# Patient Record
Sex: Male | Born: 1935 | Race: White | Hispanic: No | Smoking: Former smoker
Health system: Southern US, Community
[De-identification: ages and names within clinical notes are randomized; demographics above are authoritative.]

## PROBLEM LIST (undated history)

## (undated) DIAGNOSIS — Z950 Presence of cardiac pacemaker: Secondary | ICD-10-CM

## (undated) DIAGNOSIS — R0600 Dyspnea, unspecified: Secondary | ICD-10-CM

## (undated) DIAGNOSIS — M109 Gout, unspecified: Secondary | ICD-10-CM

## (undated) DIAGNOSIS — Z972 Presence of dental prosthetic device (complete) (partial): Secondary | ICD-10-CM

## (undated) DIAGNOSIS — Z9289 Personal history of other medical treatment: Secondary | ICD-10-CM

## (undated) DIAGNOSIS — I639 Cerebral infarction, unspecified: Secondary | ICD-10-CM

## (undated) DIAGNOSIS — D649 Anemia, unspecified: Secondary | ICD-10-CM

## (undated) DIAGNOSIS — I10 Essential (primary) hypertension: Secondary | ICD-10-CM

## (undated) HISTORY — PX: BACK SURGERY: SHX140

## (undated) HISTORY — PX: HERNIA REPAIR: SHX51

---

## 1999-04-13 ENCOUNTER — Encounter: Admission: RE | Admit: 1999-04-13 | Discharge: 1999-07-12 | Payer: Self-pay | Admitting: Internal Medicine

## 2000-06-16 ENCOUNTER — Ambulatory Visit (HOSPITAL_COMMUNITY): Admission: RE | Admit: 2000-06-16 | Discharge: 2000-06-16 | Payer: Self-pay | Admitting: General Surgery

## 2003-06-01 ENCOUNTER — Ambulatory Visit (HOSPITAL_COMMUNITY): Admission: RE | Admit: 2003-06-01 | Discharge: 2003-06-01 | Payer: Self-pay | Admitting: Internal Medicine

## 2004-11-18 ENCOUNTER — Ambulatory Visit: Admission: RE | Admit: 2004-11-18 | Discharge: 2004-11-18 | Payer: Self-pay | Admitting: Internal Medicine

## 2004-11-22 ENCOUNTER — Ambulatory Visit: Payer: Self-pay | Admitting: Pulmonary Disease

## 2004-12-12 ENCOUNTER — Ambulatory Visit: Payer: Self-pay | Admitting: Pulmonary Disease

## 2005-01-11 ENCOUNTER — Ambulatory Visit: Payer: Self-pay | Admitting: Pulmonary Disease

## 2005-02-11 ENCOUNTER — Ambulatory Visit: Payer: Self-pay | Admitting: Pulmonary Disease

## 2005-05-06 ENCOUNTER — Ambulatory Visit: Payer: Self-pay | Admitting: Pulmonary Disease

## 2005-11-18 ENCOUNTER — Ambulatory Visit: Payer: Self-pay | Admitting: Pulmonary Disease

## 2008-05-06 ENCOUNTER — Telehealth (INDEPENDENT_AMBULATORY_CARE_PROVIDER_SITE_OTHER): Payer: Self-pay | Admitting: *Deleted

## 2010-09-05 ENCOUNTER — Encounter: Payer: Self-pay | Admitting: Emergency Medicine

## 2010-09-05 ENCOUNTER — Emergency Department (HOSPITAL_COMMUNITY): Payer: Medicare Other

## 2010-09-05 ENCOUNTER — Emergency Department (HOSPITAL_COMMUNITY)
Admission: EM | Admit: 2010-09-05 | Discharge: 2010-09-05 | Disposition: A | Discharge status: Home / Self Care | Payer: Medicare Other | Attending: Emergency Medicine | Admitting: Emergency Medicine

## 2010-09-05 DIAGNOSIS — I10 Essential (primary) hypertension: Secondary | ICD-10-CM | POA: Insufficient documentation

## 2010-09-05 DIAGNOSIS — J45909 Unspecified asthma, uncomplicated: Secondary | ICD-10-CM | POA: Insufficient documentation

## 2010-09-05 DIAGNOSIS — E119 Type 2 diabetes mellitus without complications: Secondary | ICD-10-CM | POA: Insufficient documentation

## 2010-09-05 HISTORY — DX: Essential (primary) hypertension: I10

## 2010-09-05 LAB — BASIC METABOLIC PANEL
GFR calc Af Amer: >60 mL/min (ref >60)
GFR calc non Af Amer: >60 mL/min (ref >60)
Potassium: 4 mEq/L (ref 3.5–5.1)
Sodium: 142 mEq/L (ref 135–145)

## 2010-09-05 LAB — CBC
Hemoglobin: 14.5 g/dL (ref 13.0–17.0)
MCHC: 32.5 g/dL (ref 30.0–36.0)
Platelets: 198 10*3/uL (ref 150–400)
RDW: 13 % (ref 11.5–15.5)

## 2010-09-05 MED ORDER — ALBUTEROL SULFATE (5 MG/ML) 0.5% IN NEBU
2.5000 mg | INHALATION_SOLUTION | Freq: Once | RESPIRATORY_TRACT | Status: AC
  Administered 2010-09-05: 2.5 mg via RESPIRATORY_TRACT
  Filled 2010-09-05: qty 0.5

## 2010-09-05 MED ORDER — IPRATROPIUM BROMIDE 0.02 % IN SOLN
0.5000 mg | Freq: Once | RESPIRATORY_TRACT | Status: AC
  Administered 2010-09-05: 0.5 mg via RESPIRATORY_TRACT
  Filled 2010-09-05: qty 2.5

## 2010-09-05 MED ORDER — ALBUTEROL SULFATE HFA 108 (90 BASE) MCG/ACT IN AERS
1.0000 | INHALATION_SPRAY | Freq: Four times a day (QID) | RESPIRATORY_TRACT | Status: DC | PRN
Start: 2010-09-05 — End: 2019-08-25

## 2010-09-05 NOTE — ED Provider Notes (Signed)
History    Scribed for Ronnie Jakes, MD, the patient was seen in room APA07/APA07. This chart was scribed by Ronnie Lawrence. This patient's care was started at 1:20PM.     CSN: [id number] Arrival date & time: 09/05/2010 12:53 PM  Chief Complaint  Patient presents with  . Shortness of Breath   HPI Ronnie Lawrence is a 77 y.o. male brought in by ambulance, who presents to the Emergency Department complaining of moderate SOB onset 3 hours ago while at home with associated Cough (productive daily not outside of patient baseline), and wheezing. Denies chest pain, chest discomfort, and abdominal pain.    Notes hx of borderline DM, asthma (last episode 2-3 years ago).  Pt sates that sx are similar to last asthma episode.  Pt was given 2 Albuterol  treatments by [facility] for wheezing which moderately relieved sx.  Pt states he felt fine yesterday and last night.     HPI ELEMENTS:   Onset: 3 hours ago while at [place] Duration: persistent since onset  Timing: constant  Quality:  like previously experienced asthma attacks Severity: moderate  Modifying factors: Moderately relieved by administration of Albuterol breathing treatments en route by EMS  Context:  as above  Associated symptoms: +-productive cough, wheezing which has resolved following breathing treatments  PAST MEDICAL HISTORY:  Past Medical History  Diagnosis Date  . Asthma   . Diabetes mellitus   . Hypertension     PAST SURGICAL HISTORY:  History reviewed. No pertinent past surgical history.  MEDICATIONS:  Previous Medications   No medications on file     ALLERGIES:  Allergies as of 09/05/2010  . (No Known Allergies)     FAMILY HISTORY:  History reviewed. No pertinent family history.   SOCIAL HISTORY: History   Social History  . Marital Status: Widowed    Spouse Name: N/A    Number of Children: N/A  . Years of Education: N/A   Social History Main Topics  . Smoking status: Never Smoker   . Smokeless  tobacco: None  . Alcohol Use: No  . Drug Use: No  . Sexually Active:    Other Topics Concern  . None   Social History Narrative  . None     Review of Systems 10 Systems reviewed and are negative for acute change except as noted in the HPI.  Physical Exam  BP 147/76  Pulse 116  Temp(Src) 99.1 F (37.3 C) (Oral)  Resp 22  Ht 5\' 11"  (1.803 m)  Wt 210 lb (95.255 kg)  BMI 29.29 kg/m2  SpO2 96%  Physical Exam  Nursing note and vitals reviewed. Constitutional: He is oriented to person, place, and time. He appears well-developed and well-nourished. No distress.  HENT:  Head: Normocephalic and atraumatic.  Mouth/Throat: Oropharynx is clear and moist.  Eyes: EOM are normal. Pupils are equal, round, and reactive to light.  Neck: Neck supple.  Cardiovascular: Normal rate, regular rhythm and normal heart sounds.   No murmur heard. Pulmonary/Chest: Effort normal. He has no wheezes.  Abdominal: Soft. Bowel sounds are normal. He exhibits no distension. There is no tenderness. There is no rebound and no guarding.  Musculoskeletal: Normal range of motion. He exhibits no edema.  Neurological: He is alert and oriented to person, place, and time. No cranial nerve deficit.  Skin: Skin is warm and dry.  Psychiatric: He has a normal mood and affect. His behavior is normal.    ED Course  Procedures  OTHER DATA REVIEWED:  Nursing notes, vital signs, and past medical records reviewed. Lab results reviewed and considered Imaging results reviewed and considered  DIAGNOSTIC STUDIES: Oxygen Saturation is 96% on room air, normal by my interpretation.    LABS / RADIOLOGY: Results for orders placed during the hospital encounter of 09/05/10  CBC      Component Value Range   WBC 10.1  4.0 - 10.5 (K/uL)   RBC 4.49  4.22 - 5.81 (MIL/uL)   Hemoglobin 14.5  13.0 - 17.0 (g/dL)   HCT 04.5  40.9 - 81.1 (%)   MCV 99.3  78.0 - 100.0 (fL)   MCH 32.3  26.0 - 34.0 (pg)   MCHC 32.5  30.0 - 36.0  (g/dL)   RDW 91.4  78.2 - 95.6 (%)   Platelets 198  150 - 400 (K/uL)  BASIC METABOLIC PANEL      Component Value Range   Sodium 142  135 - 145 (mEq/L)   Potassium 4.0  3.5 - 5.1 (mEq/L)   Chloride 105  96 - 112 (mEq/L)   CO2 24  19 - 32 (mEq/L)   Glucose, Bld 168 (*) 70 - 99 (mg/dL)   BUN 23  6 - 23 (mg/dL)   Creatinine, Ser 2.13  0.50 - 1.35 (mg/dL)   Calcium 9.3  8.4 - 08.6 (mg/dL)   GFR calc non Af Amer >60  >60 (mL/min)   GFR calc Af Amer >60  >60 (mL/min)    Dg Chest 2 View  09/05/2010  *RADIOLOGY REPORT*  Clinical Data: Shortness of breath, asthma, hypertension, ran out of inhaler  CHEST - 2 VIEW  Comparison: None  Findings: Normal heart size and pulmonary vascularity. Calcified elongated thoracic aorta. Emphysematous bronchitic changes. Question minimal scarring in lingula. No gross infiltrate, pleural effusion, or pneumothorax. Bones appear demineralized with multilevel endplate spur formation thoracic spine.  IMPRESSION: Emphysematous and bronchitic changes. No acute abnormalities.  Original Report Authenticated By: Ronnie Lawrence, M.D.    PROCEDURES:  ED COURSE / COORDINATION OF CARE: Orders Placed This Encounter  Procedures  . DG Chest 2 View  . CBC  . Basic metabolic panel   5:78IO  Pt informed of imaging and lab results.  Pt states SOB significantly improved except for minimal wheezing.  Upon reevaluation minimal wheezing bilaterally.  Will administer additional Albuterol breathing treatment prior to discharge home.  Pt agrees with plan of discharge.       MDM: Differential Diagnosis: KNOWN HX OF ASTHMA. BUT NO RECENT PROBLEMS. SOB THIS AM AFTER BEING AWAKE. EMS RX WITH NEBS X 2 AND IMPROVED. AT DISCHARGE SOME SLIGHT WHEEZING. REPEAT RX WITH ALBUTEROL NEB WITH ATROVENT AND CLEARED. WILL RESTART ON ALBUTEROL. HAS FU WITH DR Ronnie Lawrence.    PLAN: Discharge home.  The patient is to return the emergency department if there is any worsening of symptoms. I have reviewed the  discharge instructions with the patient/family  CONDITION ON DISCHARGE: Improved and Stable.     MEDICATIONS GIVEN IN THE E.D. Medications - No data to display    I personally performed the services described in this documentation, which was scribed in my presence. The recorded information has been reviewed and considered. Ronnie Jakes, MD     Ronnie Jakes, MD 09/05/10 [phone]

## 2010-09-05 NOTE — ED Notes (Signed)
SOB x 2 hours. Pt out of inhaler at home. Received A&A and duoneb tx in route per EMS

## 2011-02-08 DIAGNOSIS — E119 Type 2 diabetes mellitus without complications: Secondary | ICD-10-CM | POA: Diagnosis not present

## 2011-02-15 ENCOUNTER — Ambulatory Visit (HOSPITAL_COMMUNITY)
Admission: RE | Admit: 2011-02-15 | Discharge: 2011-02-15 | Disposition: A | Discharge status: Home / Self Care | Payer: Medicare Other | Source: Ambulatory Visit | Attending: Internal Medicine | Admitting: Internal Medicine

## 2011-02-15 ENCOUNTER — Other Ambulatory Visit (HOSPITAL_COMMUNITY): Payer: Self-pay | Admitting: Internal Medicine

## 2011-02-15 DIAGNOSIS — M169 Osteoarthritis of hip, unspecified: Secondary | ICD-10-CM | POA: Diagnosis not present

## 2011-02-15 DIAGNOSIS — M25859 Other specified joint disorders, unspecified hip: Secondary | ICD-10-CM | POA: Diagnosis not present

## 2011-02-15 DIAGNOSIS — E119 Type 2 diabetes mellitus without complications: Secondary | ICD-10-CM | POA: Diagnosis not present

## 2011-02-15 DIAGNOSIS — M25551 Pain in right hip: Secondary | ICD-10-CM

## 2011-02-15 DIAGNOSIS — G5790 Unspecified mononeuropathy of unspecified lower limb: Secondary | ICD-10-CM | POA: Diagnosis not present

## 2011-02-15 DIAGNOSIS — M25559 Pain in unspecified hip: Secondary | ICD-10-CM | POA: Diagnosis not present

## 2011-06-18 DIAGNOSIS — Z79899 Other long term (current) drug therapy: Secondary | ICD-10-CM | POA: Diagnosis not present

## 2011-06-18 DIAGNOSIS — E119 Type 2 diabetes mellitus without complications: Secondary | ICD-10-CM | POA: Diagnosis not present

## 2011-06-18 DIAGNOSIS — E785 Hyperlipidemia, unspecified: Secondary | ICD-10-CM | POA: Diagnosis not present

## 2011-06-25 DIAGNOSIS — G5790 Unspecified mononeuropathy of unspecified lower limb: Secondary | ICD-10-CM | POA: Diagnosis not present

## 2011-06-25 DIAGNOSIS — Z1212 Encounter for screening for malignant neoplasm of rectum: Secondary | ICD-10-CM | POA: Diagnosis not present

## 2011-06-25 DIAGNOSIS — I4949 Other premature depolarization: Secondary | ICD-10-CM | POA: Diagnosis not present

## 2011-06-25 DIAGNOSIS — E119 Type 2 diabetes mellitus without complications: Secondary | ICD-10-CM | POA: Diagnosis not present

## 2011-07-23 DIAGNOSIS — E119 Type 2 diabetes mellitus without complications: Secondary | ICD-10-CM | POA: Diagnosis not present

## 2011-07-23 DIAGNOSIS — H40019 Open angle with borderline findings, low risk, unspecified eye: Secondary | ICD-10-CM | POA: Diagnosis not present

## 2011-07-23 DIAGNOSIS — H2510 Age-related nuclear cataract, unspecified eye: Secondary | ICD-10-CM | POA: Diagnosis not present

## 2011-07-31 DIAGNOSIS — E1149 Type 2 diabetes mellitus with other diabetic neurological complication: Secondary | ICD-10-CM | POA: Diagnosis not present

## 2011-07-31 DIAGNOSIS — E1142 Type 2 diabetes mellitus with diabetic polyneuropathy: Secondary | ICD-10-CM | POA: Diagnosis not present

## 2011-07-31 DIAGNOSIS — M810 Age-related osteoporosis without current pathological fracture: Secondary | ICD-10-CM | POA: Diagnosis not present

## 2011-07-31 DIAGNOSIS — R5381 Other malaise: Secondary | ICD-10-CM | POA: Diagnosis not present

## 2011-07-31 DIAGNOSIS — E538 Deficiency of other specified B group vitamins: Secondary | ICD-10-CM | POA: Diagnosis not present

## 2011-07-31 DIAGNOSIS — Z79899 Other long term (current) drug therapy: Secondary | ICD-10-CM | POA: Diagnosis not present

## 2011-08-28 DIAGNOSIS — R110 Nausea: Secondary | ICD-10-CM | POA: Diagnosis not present

## 2011-08-28 DIAGNOSIS — E1142 Type 2 diabetes mellitus with diabetic polyneuropathy: Secondary | ICD-10-CM | POA: Diagnosis not present

## 2011-08-28 DIAGNOSIS — R5381 Other malaise: Secondary | ICD-10-CM | POA: Diagnosis not present

## 2011-08-28 DIAGNOSIS — E1149 Type 2 diabetes mellitus with other diabetic neurological complication: Secondary | ICD-10-CM | POA: Diagnosis not present

## 2011-08-28 DIAGNOSIS — R5383 Other fatigue: Secondary | ICD-10-CM | POA: Diagnosis not present

## 2011-10-09 DIAGNOSIS — E1149 Type 2 diabetes mellitus with other diabetic neurological complication: Secondary | ICD-10-CM | POA: Diagnosis not present

## 2011-10-09 DIAGNOSIS — E1142 Type 2 diabetes mellitus with diabetic polyneuropathy: Secondary | ICD-10-CM | POA: Diagnosis not present

## 2011-10-18 DIAGNOSIS — E119 Type 2 diabetes mellitus without complications: Secondary | ICD-10-CM | POA: Diagnosis not present

## 2011-10-25 DIAGNOSIS — E119 Type 2 diabetes mellitus without complications: Secondary | ICD-10-CM | POA: Diagnosis not present

## 2011-10-25 DIAGNOSIS — G5790 Unspecified mononeuropathy of unspecified lower limb: Secondary | ICD-10-CM | POA: Diagnosis not present

## 2011-11-22 DIAGNOSIS — Z23 Encounter for immunization: Secondary | ICD-10-CM | POA: Diagnosis not present

## 2011-12-09 DIAGNOSIS — E1142 Type 2 diabetes mellitus with diabetic polyneuropathy: Secondary | ICD-10-CM | POA: Diagnosis not present

## 2011-12-09 DIAGNOSIS — E1149 Type 2 diabetes mellitus with other diabetic neurological complication: Secondary | ICD-10-CM | POA: Diagnosis not present

## 2012-01-29 DIAGNOSIS — H40019 Open angle with borderline findings, low risk, unspecified eye: Secondary | ICD-10-CM | POA: Diagnosis not present

## 2012-02-05 DIAGNOSIS — E1149 Type 2 diabetes mellitus with other diabetic neurological complication: Secondary | ICD-10-CM | POA: Diagnosis not present

## 2012-02-05 DIAGNOSIS — E1142 Type 2 diabetes mellitus with diabetic polyneuropathy: Secondary | ICD-10-CM | POA: Diagnosis not present

## 2012-02-24 DIAGNOSIS — E119 Type 2 diabetes mellitus without complications: Secondary | ICD-10-CM | POA: Diagnosis not present

## 2012-03-02 DIAGNOSIS — E119 Type 2 diabetes mellitus without complications: Secondary | ICD-10-CM | POA: Diagnosis not present

## 2012-04-02 DIAGNOSIS — E1149 Type 2 diabetes mellitus with other diabetic neurological complication: Secondary | ICD-10-CM | POA: Diagnosis not present

## 2012-04-02 DIAGNOSIS — E1142 Type 2 diabetes mellitus with diabetic polyneuropathy: Secondary | ICD-10-CM | POA: Diagnosis not present

## 2012-04-21 ENCOUNTER — Ambulatory Visit (HOSPITAL_COMMUNITY)
Admission: RE | Admit: 2012-04-21 | Discharge: 2012-04-21 | Disposition: A | Discharge status: Home / Self Care | Payer: Medicare Other | Source: Ambulatory Visit | Attending: Internal Medicine | Admitting: Internal Medicine

## 2012-04-21 ENCOUNTER — Other Ambulatory Visit (HOSPITAL_COMMUNITY): Payer: Self-pay | Admitting: Internal Medicine

## 2012-04-21 DIAGNOSIS — J438 Other emphysema: Secondary | ICD-10-CM | POA: Diagnosis not present

## 2012-04-21 DIAGNOSIS — R059 Cough, unspecified: Secondary | ICD-10-CM | POA: Insufficient documentation

## 2012-04-21 DIAGNOSIS — R05 Cough: Secondary | ICD-10-CM | POA: Diagnosis not present

## 2012-04-21 DIAGNOSIS — R110 Nausea: Secondary | ICD-10-CM | POA: Diagnosis not present

## 2012-04-21 DIAGNOSIS — R5383 Other fatigue: Secondary | ICD-10-CM | POA: Diagnosis not present

## 2012-04-21 DIAGNOSIS — Z79899 Other long term (current) drug therapy: Secondary | ICD-10-CM | POA: Diagnosis not present

## 2012-04-21 DIAGNOSIS — R5381 Other malaise: Secondary | ICD-10-CM | POA: Diagnosis not present

## 2012-04-27 DIAGNOSIS — R5381 Other malaise: Secondary | ICD-10-CM | POA: Diagnosis not present

## 2012-04-27 DIAGNOSIS — R490 Dysphonia: Secondary | ICD-10-CM | POA: Diagnosis not present

## 2012-04-27 DIAGNOSIS — H6120 Impacted cerumen, unspecified ear: Secondary | ICD-10-CM | POA: Diagnosis not present

## 2012-06-30 DIAGNOSIS — M109 Gout, unspecified: Secondary | ICD-10-CM | POA: Diagnosis not present

## 2012-06-30 DIAGNOSIS — D696 Thrombocytopenia, unspecified: Secondary | ICD-10-CM | POA: Diagnosis not present

## 2012-06-30 DIAGNOSIS — E119 Type 2 diabetes mellitus without complications: Secondary | ICD-10-CM | POA: Diagnosis not present

## 2012-06-30 DIAGNOSIS — Z79899 Other long term (current) drug therapy: Secondary | ICD-10-CM | POA: Diagnosis not present

## 2012-06-30 DIAGNOSIS — E785 Hyperlipidemia, unspecified: Secondary | ICD-10-CM | POA: Diagnosis not present

## 2012-07-07 DIAGNOSIS — G4730 Sleep apnea, unspecified: Secondary | ICD-10-CM | POA: Diagnosis not present

## 2012-07-07 DIAGNOSIS — Z1212 Encounter for screening for malignant neoplasm of rectum: Secondary | ICD-10-CM | POA: Diagnosis not present

## 2012-07-07 DIAGNOSIS — I4949 Other premature depolarization: Secondary | ICD-10-CM | POA: Diagnosis not present

## 2012-07-07 DIAGNOSIS — E785 Hyperlipidemia, unspecified: Secondary | ICD-10-CM | POA: Diagnosis not present

## 2012-07-07 DIAGNOSIS — E1149 Type 2 diabetes mellitus with other diabetic neurological complication: Secondary | ICD-10-CM | POA: Diagnosis not present

## 2012-09-29 DIAGNOSIS — H40019 Open angle with borderline findings, low risk, unspecified eye: Secondary | ICD-10-CM | POA: Diagnosis not present

## 2012-09-29 DIAGNOSIS — E119 Type 2 diabetes mellitus without complications: Secondary | ICD-10-CM | POA: Diagnosis not present

## 2012-09-29 DIAGNOSIS — H2510 Age-related nuclear cataract, unspecified eye: Secondary | ICD-10-CM | POA: Diagnosis not present

## 2012-11-04 DIAGNOSIS — E119 Type 2 diabetes mellitus without complications: Secondary | ICD-10-CM | POA: Diagnosis not present

## 2012-11-11 DIAGNOSIS — Z23 Encounter for immunization: Secondary | ICD-10-CM | POA: Diagnosis not present

## 2012-11-11 DIAGNOSIS — E1149 Type 2 diabetes mellitus with other diabetic neurological complication: Secondary | ICD-10-CM | POA: Diagnosis not present

## 2013-03-03 DIAGNOSIS — H40019 Open angle with borderline findings, low risk, unspecified eye: Secondary | ICD-10-CM | POA: Diagnosis not present

## 2013-03-08 DIAGNOSIS — E119 Type 2 diabetes mellitus without complications: Secondary | ICD-10-CM | POA: Diagnosis not present

## 2013-06-24 ENCOUNTER — Encounter (INDEPENDENT_AMBULATORY_CARE_PROVIDER_SITE_OTHER): Payer: Self-pay | Admitting: *Deleted

## 2013-06-24 ENCOUNTER — Encounter (INDEPENDENT_AMBULATORY_CARE_PROVIDER_SITE_OTHER): Payer: Self-pay

## 2013-07-05 DIAGNOSIS — Z125 Encounter for screening for malignant neoplasm of prostate: Secondary | ICD-10-CM | POA: Diagnosis not present

## 2013-07-05 DIAGNOSIS — R5381 Other malaise: Secondary | ICD-10-CM | POA: Diagnosis not present

## 2013-07-05 DIAGNOSIS — E785 Hyperlipidemia, unspecified: Secondary | ICD-10-CM | POA: Diagnosis not present

## 2013-07-05 DIAGNOSIS — E119 Type 2 diabetes mellitus without complications: Secondary | ICD-10-CM | POA: Diagnosis not present

## 2013-07-05 DIAGNOSIS — M109 Gout, unspecified: Secondary | ICD-10-CM | POA: Diagnosis not present

## 2013-07-05 DIAGNOSIS — D696 Thrombocytopenia, unspecified: Secondary | ICD-10-CM | POA: Diagnosis not present

## 2013-07-05 DIAGNOSIS — Z79899 Other long term (current) drug therapy: Secondary | ICD-10-CM | POA: Diagnosis not present

## 2013-07-12 DIAGNOSIS — I4949 Other premature depolarization: Secondary | ICD-10-CM | POA: Diagnosis not present

## 2013-07-12 DIAGNOSIS — Z0000 Encounter for general adult medical examination without abnormal findings: Secondary | ICD-10-CM | POA: Diagnosis not present

## 2013-07-12 DIAGNOSIS — Z23 Encounter for immunization: Secondary | ICD-10-CM | POA: Diagnosis not present

## 2013-07-29 ENCOUNTER — Telehealth (INDEPENDENT_AMBULATORY_CARE_PROVIDER_SITE_OTHER): Payer: Self-pay | Admitting: *Deleted

## 2013-07-29 ENCOUNTER — Other Ambulatory Visit (INDEPENDENT_AMBULATORY_CARE_PROVIDER_SITE_OTHER): Payer: Self-pay | Admitting: *Deleted

## 2013-07-29 DIAGNOSIS — Z1211 Encounter for screening for malignant neoplasm of colon: Secondary | ICD-10-CM

## 2013-07-29 DIAGNOSIS — Z86010 Personal history of colonic polyps: Secondary | ICD-10-CM

## 2013-07-29 NOTE — Telephone Encounter (Signed)
Patient needs trilyte 

## 2013-07-29 NOTE — Telephone Encounter (Signed)
  Procedure: tcs  Reason/Indication:  Hx polyps  Has patient had this procedure before?  Yes, 2010 -- scanned  If so, when, by whom and where?    Is there a family history of colon cancer?  no  Who?  What age when diagnosed?    Is patient diabetic?   yes      Does patient have prosthetic heart valve?  no  Do you have a pacemaker?  no  Has patient ever had endocarditis? no  Has patient had joint replacement within last 12 months?  no  Does patient tend to be constipated or take laxatives? no  Is patient on Coumadin, Plavix and/or Aspirin? yes  Medications: asa 81 mg daily, enalapril 5 mg daily, magnesium oxide 400 mg daily, metformin 500 mg bid (am & pm), atorvastatin 10 mg daily, latanoprost eye drop 0.005 % 1 drop each eye nightly, Proventil hfa 90 mg prn  Allergies: nkda  Medication Adjustment: asa 2 days, hold metformin evening before and morning of  Procedure date & time: 08/13/13 at 1050

## 2013-08-02 MED ORDER — PEG 3350-KCL-NA BICARB-NACL 420 G PO SOLR: 4000.0000 mL | Freq: Once | ORAL | Status: DC

## 2013-08-02 NOTE — Telephone Encounter (Signed)
agree

## 2013-08-09 ENCOUNTER — Encounter (HOSPITAL_COMMUNITY): Payer: Self-pay | Admitting: Pharmacy Technician

## 2013-08-13 ENCOUNTER — Encounter (HOSPITAL_COMMUNITY)
Admission: RE | Disposition: A | Discharge status: Home / Self Care | Payer: Self-pay | Source: Ambulatory Visit | Attending: Internal Medicine

## 2013-08-13 ENCOUNTER — Ambulatory Visit (HOSPITAL_COMMUNITY)
Admission: RE | Admit: 2013-08-13 | Discharge: 2013-08-13 | Disposition: A | Discharge status: Home / Self Care | Payer: Medicare Other | Source: Ambulatory Visit | Attending: Internal Medicine | Admitting: Internal Medicine

## 2013-08-13 ENCOUNTER — Encounter (HOSPITAL_COMMUNITY): Payer: Self-pay | Admitting: *Deleted

## 2013-08-13 DIAGNOSIS — Z7982 Long term (current) use of aspirin: Secondary | ICD-10-CM | POA: Diagnosis not present

## 2013-08-13 DIAGNOSIS — K5730 Diverticulosis of large intestine without perforation or abscess without bleeding: Secondary | ICD-10-CM | POA: Diagnosis not present

## 2013-08-13 DIAGNOSIS — Z860100 Personal history of colon polyps, unspecified: Secondary | ICD-10-CM | POA: Insufficient documentation

## 2013-08-13 DIAGNOSIS — K644 Residual hemorrhoidal skin tags: Secondary | ICD-10-CM | POA: Insufficient documentation

## 2013-08-13 DIAGNOSIS — E119 Type 2 diabetes mellitus without complications: Secondary | ICD-10-CM | POA: Diagnosis not present

## 2013-08-13 DIAGNOSIS — Z79899 Other long term (current) drug therapy: Secondary | ICD-10-CM | POA: Insufficient documentation

## 2013-08-13 DIAGNOSIS — Z800 Family history of malignant neoplasm of digestive organs: Secondary | ICD-10-CM | POA: Insufficient documentation

## 2013-08-13 DIAGNOSIS — Z09 Encounter for follow-up examination after completed treatment for conditions other than malignant neoplasm: Secondary | ICD-10-CM | POA: Insufficient documentation

## 2013-08-13 DIAGNOSIS — I10 Essential (primary) hypertension: Secondary | ICD-10-CM | POA: Diagnosis not present

## 2013-08-13 HISTORY — PX: COLONOSCOPY: SHX5424

## 2013-08-13 LAB — GLUCOSE, CAPILLARY: Glucose-Capillary: 125 mg/dL — ABNORMAL HIGH (ref 70–99)

## 2013-08-13 SURGERY — COLONOSCOPY
Anesthesia: Moderate Sedation

## 2013-08-13 MED ORDER — MIDAZOLAM HCL 5 MG/5ML IJ SOLN
INTRAMUSCULAR | Status: AC
  Filled 2013-08-13: qty 10

## 2013-08-13 MED ORDER — SODIUM CHLORIDE 0.9 % IV SOLN
INTRAVENOUS | Status: DC
  Administered 2013-08-13: 10:00:00 via INTRAVENOUS

## 2013-08-13 MED ORDER — MEPERIDINE HCL 50 MG/ML IJ SOLN
INTRAMUSCULAR | Status: AC
  Filled 2013-08-13: qty 1

## 2013-08-13 MED ORDER — MEPERIDINE HCL 50 MG/ML IJ SOLN
INTRAMUSCULAR | Status: DC | PRN
  Administered 2013-08-13: 25 mg

## 2013-08-13 MED ORDER — MIDAZOLAM HCL 5 MG/5ML IJ SOLN
INTRAMUSCULAR | Status: DC | PRN
  Administered 2013-08-13: 2 mg via INTRAVENOUS
  Administered 2013-08-13: 1 mg via INTRAVENOUS

## 2013-08-13 NOTE — Op Note (Signed)
COLONOSCOPY PROCEDURE REPORT  PATIENT:  Ronnie Lawrence  MR#:  [id number] Birthdate:  July 23, 1927, 80 y.o., male Endoscopist:  Dr. Rogene Houston, MD Referred By:  Dr. [name], MD  Procedure Date: 08/13/2013  Procedure:   Colonoscopy  Indications:  Patient is a 52-year-old Caucasian male with history of colonic adenoma and family history of colon carcinoma in his mother was 59 at the time of diagnosis. Patient's here for surveillance colonoscopy.  Informed Consent:  The procedure and risks were reviewed with the patient and informed consent was obtained.  Medications:  Demerol 25 mg IV Versed 3 mg IV  Description of procedure:  After a digital rectal exam was performed, that colonoscope was advanced from the anus through the rectum and colon to the area of the cecum, ileocecal valve and appendiceal orifice. The cecum was deeply intubated. These structures were well-seen and photographed for the record. From the level of the cecum and ileocecal valve, the scope was slowly and cautiously withdrawn. The mucosal surfaces were carefully surveyed utilizing scope tip to flexion to facilitate fold flattening as needed. The scope was pulled down into the rectum where a thorough exam including retroflexion was performed.  Findings:   Prep satisfactory. Scattered diverticula noted throughout the colon. No polyps or other mucosal abnormalities noted. Normal rectal mucosa. Small hemorrhoids below the dentate line.   Therapeutic/Diagnostic Maneuvers Performed:  None  Complications:  None  Cecal Withdrawal Time:  12 minutes  Impression:  Examination performed to cecum. No evidence of recurrent polyps. Pancolonic diverticulosis. Small external hemorrhoids.  Recommendations:  Standard instructions given.   Ronnie Lawrence,Ronnie Lawrence  08/13/2013 11:52 AM  CC: Dr. [name], MD & Dr. Rayne Du ref. provider found

## 2013-08-13 NOTE — Discharge Instructions (Signed)
Resume usual medications and high fiber diet. No driving for 24 hours.   Colonoscopy, Care After Refer to this sheet in the next few weeks. These instructions provide you with information on caring for yourself after your procedure. Your health care provider may also give you more specific instructions. Your treatment has been planned according to current medical practices, but problems sometimes occur. Call your health care provider if you have any problems or questions after your procedure. WHAT TO EXPECT AFTER THE PROCEDURE  After your procedure, it is typical to have the following:  A small amount of blood in your stool.  Moderate amounts of gas and mild abdominal cramping or bloating. HOME CARE INSTRUCTIONS  Do not drive, operate machinery, or sign important documents for 24 hours.  You may shower and resume your regular physical activities, but move at a slower pace for the first 24 hours.  Take frequent rest periods for the first 24 hours.  Walk around or put a warm pack on your abdomen to help reduce abdominal cramping and bloating.  Drink enough fluids to keep your urine clear or pale yellow.  You may resume your normal diet as instructed by your health care provider. Avoid heavy or fried foods that are hard to digest.  Avoid drinking alcohol for 24 hours or as instructed by your health care provider.  Only take over-the-counter or prescription medicines as directed by your health care provider.  If a tissue sample (biopsy) was taken during your procedure:  Do not take aspirin or blood thinners for 7 days, or as instructed by your health care provider.  Do not drink alcohol for 7 days, or as instructed by your health care provider.  Eat soft foods for the first 24 hours. SEEK MEDICAL CARE IF: You have persistent spotting of blood in your stool 2-3 days after the procedure. SEEK IMMEDIATE MEDICAL CARE IF:  You have more than a small spotting of blood in your  stool.  You pass large blood clots in your stool.  Your abdomen is swollen (distended).  You have nausea or vomiting.  You have a fever.  You have increasing abdominal pain that is not relieved with medicine. Document Released: 08/22/2003 Document Revised: 10/28/2012 Document Reviewed: 09/14/2012 [facility] Patient Information 2015 [place], [place]. This information is not intended to replace advice given to you by your health care provider. Make sure you discuss any questions you have with your health care provider. High-Fiber Diet Fiber is found in fruits, vegetables, and grains. A high-fiber diet encourages the addition of more whole grains, legumes, fruits, and vegetables in your diet. The recommended amount of fiber for adult males is 38 g per day. For adult females, it is 25 g per day. Pregnant and lactating women should get 28 g of fiber per day. If you have a digestive or bowel problem, ask your caregiver for advice before adding high-fiber foods to your diet. Eat a variety of high-fiber foods instead of only a select few type of foods.  PURPOSE  To increase stool bulk.  To make bowel movements more regular to prevent constipation.  To lower cholesterol.  To prevent overeating. WHEN IS THIS DIET USED?  It may be used if you have constipation and hemorrhoids.  It may be used if you have uncomplicated diverticulosis (intestine condition) and irritable bowel syndrome.  It may be used if you need help with weight management.  It may be used if you want to add it to your diet as  a protective measure against atherosclerosis, diabetes, and cancer. SOURCES OF FIBER  Whole-grain breads and cereals.  Fruits, such as apples, oranges, bananas, berries, prunes, and pears.  Vegetables, such as green peas, carrots, sweet potatoes, beets, broccoli, cabbage, spinach, and artichokes.  Legumes, such split peas, soy, lentils.  Almonds. FIBER CONTENT IN FOODS Starches and Grains /  Dietary Fiber (g)  Cheerios, 1 cup / 3 g  Corn Flakes cereal, 1 cup / 0.7 g  Rice crispy treat cereal, 1 cup / 0.3 g  Instant oatmeal (cooked),  cup / 2 g  Frosted wheat cereal, 1 cup / 5.1 g  Brown, long-grain rice (cooked), 1 cup / 3.5 g  White, long-grain rice (cooked), 1 cup / 0.6 g  Enriched macaroni (cooked), 1 cup / 2.5 g Legumes / Dietary Fiber (g)  Baked beans (canned, plain, or vegetarian),  cup / 5.2 g  Kidney beans (canned),  cup / 6.8 g  Pinto beans (cooked),  cup / 5.5 g Breads and Crackers / Dietary Fiber (g)  Plain or honey graham crackers, 2 squares / 0.7 g  Saltine crackers, 3 squares / 0.3 g  Plain, salted pretzels, 10 pieces / 1.8 g  Whole-wheat bread, 1 slice / 1.9 g  White bread, 1 slice / 0.7 g  Raisin bread, 1 slice / 1.2 g  Plain bagel, 3 oz / 2 g  Flour tortilla, 1 oz / 0.9 g  Corn tortilla, 1 small / 1.5 g  Hamburger or hotdog bun, 1 small / 0.9 g Fruits / Dietary Fiber (g)  Apple with skin, 1 medium / 4.4 g  Sweetened applesauce,  cup / 1.5 g  Banana,  medium / 1.5 g  Grapes, 10 grapes / 0.4 g  Orange, 1 small / 2.3 g  Raisin, 1.5 oz / 1.6 g  Melon, 1 cup / 1.4 g Vegetables / Dietary Fiber (g)  Green beans (canned),  cup / 1.3 g  Carrots (cooked),  cup / 2.3 g  Broccoli (cooked),  cup / 2.8 g  Peas (cooked),  cup / 4.4 g  Mashed potatoes,  cup / 1.6 g  Lettuce, 1 cup / 0.5 g  Corn (canned),  cup / 1.6 g  Tomato,  cup / 1.1 g

## 2013-08-13 NOTE — H&P (Signed)
Ronnie Lawrence is an 80 y.o. male.   Chief Complaint: Patient is here for colonoscopy. HPI: Patient is a 89-year-old Caucasian male with history of colonic adenomas and family history of colon carcinoma in his mother. He is here for surveillance colonoscopy. His last exam was in June 2010 with removal of single polyp small polyp was coagulated. He denies abdominal pain change in his bowel habits or rectal bleeding. Family history significant for colon carcinoma in his mother at age 52 and she was found to have advanced disease.  Past Medical History  Diagnosis Date  . Asthma   . Diabetes mellitus   . Hypertension     Past Surgical History  Procedure Laterality Date  . Back surgery    . Hernia repair      bilateral    History reviewed. No pertinent family history. Social History:  reports that he has never smoked. He does not have any smokeless tobacco history on file. He reports that he does not drink alcohol or use illicit drugs.  Allergies: No Known Allergies  Medications Prior to Admission  Medication Sig Dispense Refill  . albuterol (PROVENTIL HFA;VENTOLIN HFA) 108 (90 BASE) MCG/ACT inhaler Inhale 1-2 puffs into the lungs every 6 (six) hours as needed for wheezing.  1 Inhaler  2  . aspirin EC 81 MG tablet Take 81 mg by mouth daily.        Marland Kitchen atorvastatin (LIPITOR) 10 MG tablet Take 10 mg by mouth at bedtime.        . enalapril (VASOTEC) 5 MG tablet Take 5 mg by mouth daily.        . indomethacin (INDOCIN) 25 MG capsule Take 25 mg by mouth 3 (three) times daily as needed (gout pain).      Marland Kitchen latanoprost (XALATAN) 0.005 % ophthalmic solution Place 1 drop into both eyes at bedtime.      . magnesium oxide (MAG-OX) 400 MG tablet Take 400 mg by mouth daily.        . metFORMIN (GLUCOPHAGE-XR) 500 MG 24 hr tablet Take 500 mg by mouth 2 (two) times daily.       . polyethylene glycol-electrolytes (NULYTELY/GOLYTELY) 420 G solution Take 4,000 mLs by mouth once.  4000 mL  0    Results for  orders placed during the hospital encounter of 08/13/13 (from the past 48 hour(s))  GLUCOSE, CAPILLARY     Status: Abnormal   Collection Time    08/13/13  9:52 AM      Result Value Ref Range   Glucose-Capillary 125 (*) 70 - 99 mg/dL   No results found.  ROS  Blood pressure 145/66, pulse 72, temperature 97.6 F (36.4 C), temperature source Oral, resp. rate 18, height 5\' 11"  (1.803 m), weight 190 lb (86.183 kg), SpO2 98.00%. Physical Exam  Constitutional: He appears well-developed and well-nourished.  HENT:  Mouth/Throat: Oropharynx is clear and moist.  Eyes: Conjunctivae are normal. No scleral icterus.  Neck: No thyromegaly present.  Cardiovascular: Normal rate, regular rhythm and normal heart sounds.   No murmur heard. Respiratory: Effort normal and breath sounds normal.  GI: Soft. He exhibits no distension and no mass. There is no tenderness.  Musculoskeletal: He exhibits no edema.  Lymphadenopathy:    He has no cervical adenopathy.  Neurological: He is alert.  Skin: Skin is warm and dry.     Assessment/Plan History of colonic adenoma. Family history of colon carcinoma in mother age 76. Surveillance colonoscopy.  REHMAN,NAJEEB U 08/13/2013, 11:17  AM    

## 2013-08-16 ENCOUNTER — Encounter (HOSPITAL_COMMUNITY): Payer: Self-pay | Admitting: Internal Medicine

## 2013-08-31 DIAGNOSIS — E119 Type 2 diabetes mellitus without complications: Secondary | ICD-10-CM | POA: Diagnosis not present

## 2013-08-31 DIAGNOSIS — H40019 Open angle with borderline findings, low risk, unspecified eye: Secondary | ICD-10-CM | POA: Diagnosis not present

## 2013-08-31 DIAGNOSIS — H2510 Age-related nuclear cataract, unspecified eye: Secondary | ICD-10-CM | POA: Diagnosis not present

## 2013-11-08 DIAGNOSIS — E119 Type 2 diabetes mellitus without complications: Secondary | ICD-10-CM | POA: Diagnosis not present

## 2013-11-15 DIAGNOSIS — M1000 Idiopathic gout, unspecified site: Secondary | ICD-10-CM | POA: Diagnosis not present

## 2013-11-15 DIAGNOSIS — E1140 Type 2 diabetes mellitus with diabetic neuropathy, unspecified: Secondary | ICD-10-CM | POA: Diagnosis not present

## 2013-11-15 DIAGNOSIS — Z23 Encounter for immunization: Secondary | ICD-10-CM | POA: Diagnosis not present

## 2014-03-14 DIAGNOSIS — E119 Type 2 diabetes mellitus without complications: Secondary | ICD-10-CM | POA: Diagnosis not present

## 2014-03-21 DIAGNOSIS — E1129 Type 2 diabetes mellitus with other diabetic kidney complication: Secondary | ICD-10-CM | POA: Diagnosis not present

## 2014-03-21 DIAGNOSIS — M1000 Idiopathic gout, unspecified site: Secondary | ICD-10-CM | POA: Diagnosis not present

## 2014-03-23 DIAGNOSIS — H40023 Open angle with borderline findings, high risk, bilateral: Secondary | ICD-10-CM | POA: Diagnosis not present

## 2014-05-18 DIAGNOSIS — J029 Acute pharyngitis, unspecified: Secondary | ICD-10-CM | POA: Diagnosis not present

## 2014-07-11 DIAGNOSIS — E1142 Type 2 diabetes mellitus with diabetic polyneuropathy: Secondary | ICD-10-CM | POA: Diagnosis not present

## 2014-07-11 DIAGNOSIS — E785 Hyperlipidemia, unspecified: Secondary | ICD-10-CM | POA: Diagnosis not present

## 2014-07-11 DIAGNOSIS — Z79899 Other long term (current) drug therapy: Secondary | ICD-10-CM | POA: Diagnosis not present

## 2014-07-11 DIAGNOSIS — E119 Type 2 diabetes mellitus without complications: Secondary | ICD-10-CM | POA: Diagnosis not present

## 2014-07-11 DIAGNOSIS — M109 Gout, unspecified: Secondary | ICD-10-CM | POA: Diagnosis not present

## 2014-07-29 DIAGNOSIS — E785 Hyperlipidemia, unspecified: Secondary | ICD-10-CM | POA: Diagnosis not present

## 2014-07-29 DIAGNOSIS — G629 Polyneuropathy, unspecified: Secondary | ICD-10-CM | POA: Diagnosis not present

## 2014-07-29 DIAGNOSIS — D692 Other nonthrombocytopenic purpura: Secondary | ICD-10-CM | POA: Diagnosis not present

## 2014-07-29 DIAGNOSIS — E1129 Type 2 diabetes mellitus with other diabetic kidney complication: Secondary | ICD-10-CM | POA: Diagnosis not present

## 2014-11-08 DIAGNOSIS — H40023 Open angle with borderline findings, high risk, bilateral: Secondary | ICD-10-CM | POA: Diagnosis not present

## 2014-11-08 DIAGNOSIS — E119 Type 2 diabetes mellitus without complications: Secondary | ICD-10-CM | POA: Diagnosis not present

## 2014-11-08 DIAGNOSIS — H43821 Vitreomacular adhesion, right eye: Secondary | ICD-10-CM | POA: Diagnosis not present

## 2014-11-15 DIAGNOSIS — Z23 Encounter for immunization: Secondary | ICD-10-CM | POA: Diagnosis not present

## 2014-11-17 DIAGNOSIS — H43821 Vitreomacular adhesion, right eye: Secondary | ICD-10-CM | POA: Diagnosis not present

## 2014-12-02 DIAGNOSIS — E119 Type 2 diabetes mellitus without complications: Secondary | ICD-10-CM | POA: Diagnosis not present

## 2014-12-09 DIAGNOSIS — Z6827 Body mass index (BMI) 27.0-27.9, adult: Secondary | ICD-10-CM | POA: Diagnosis not present

## 2014-12-09 DIAGNOSIS — E1129 Type 2 diabetes mellitus with other diabetic kidney complication: Secondary | ICD-10-CM | POA: Diagnosis not present

## 2014-12-09 DIAGNOSIS — M1000 Idiopathic gout, unspecified site: Secondary | ICD-10-CM | POA: Diagnosis not present

## 2015-04-17 DIAGNOSIS — E1129 Type 2 diabetes mellitus with other diabetic kidney complication: Secondary | ICD-10-CM | POA: Diagnosis not present

## 2015-04-17 DIAGNOSIS — M1000 Idiopathic gout, unspecified site: Secondary | ICD-10-CM | POA: Diagnosis not present

## 2015-04-17 DIAGNOSIS — E875 Hyperkalemia: Secondary | ICD-10-CM | POA: Diagnosis not present

## 2015-04-17 DIAGNOSIS — Z6827 Body mass index (BMI) 27.0-27.9, adult: Secondary | ICD-10-CM | POA: Diagnosis not present

## 2015-04-18 DIAGNOSIS — E119 Type 2 diabetes mellitus without complications: Secondary | ICD-10-CM | POA: Diagnosis not present

## 2019-08-25 ENCOUNTER — Other Ambulatory Visit: Payer: Self-pay

## 2019-08-25 ENCOUNTER — Encounter: Payer: Self-pay | Admitting: Ophthalmology

## 2019-08-26 NOTE — Discharge Instructions (Signed)

## 2019-08-27 ENCOUNTER — Other Ambulatory Visit: Payer: Self-pay

## 2019-08-27 ENCOUNTER — Other Ambulatory Visit
Admission: RE | Admit: 2019-08-27 | Discharge: 2019-08-27 | Disposition: A | Discharge status: Home / Self Care | Payer: Medicare Other | Source: Ambulatory Visit | Attending: Ophthalmology | Admitting: Ophthalmology

## 2019-08-27 DIAGNOSIS — Z20822 Contact with and (suspected) exposure to covid-19: Secondary | ICD-10-CM | POA: Insufficient documentation

## 2019-08-27 DIAGNOSIS — Z01812 Encounter for preprocedural laboratory examination: Secondary | ICD-10-CM | POA: Diagnosis present

## 2019-08-27 LAB — SARS CORONAVIRUS 2 (TAT 6-24 HRS): SARS Coronavirus 2: NEGATIVE

## 2019-08-31 ENCOUNTER — Ambulatory Visit: Payer: Medicare Other | Admitting: Anesthesiology

## 2019-08-31 ENCOUNTER — Ambulatory Visit
Admission: RE | Admit: 2019-08-31 | Discharge: 2019-08-31 | Disposition: A | Discharge status: Home / Self Care | Payer: Medicare Other | Attending: Ophthalmology | Admitting: Ophthalmology

## 2019-08-31 ENCOUNTER — Other Ambulatory Visit: Payer: Self-pay

## 2019-08-31 ENCOUNTER — Encounter: Payer: Self-pay | Admitting: Ophthalmology

## 2019-08-31 ENCOUNTER — Encounter
Admission: RE | Disposition: A | Discharge status: Home / Self Care | Payer: Self-pay | Source: Home / Self Care | Attending: Ophthalmology

## 2019-08-31 DIAGNOSIS — Z7982 Long term (current) use of aspirin: Secondary | ICD-10-CM | POA: Diagnosis not present

## 2019-08-31 DIAGNOSIS — Z87891 Personal history of nicotine dependence: Secondary | ICD-10-CM | POA: Insufficient documentation

## 2019-08-31 DIAGNOSIS — H2512 Age-related nuclear cataract, left eye: Secondary | ICD-10-CM | POA: Insufficient documentation

## 2019-08-31 DIAGNOSIS — Z7984 Long term (current) use of oral hypoglycemic drugs: Secondary | ICD-10-CM | POA: Insufficient documentation

## 2019-08-31 DIAGNOSIS — E1136 Type 2 diabetes mellitus with diabetic cataract: Secondary | ICD-10-CM | POA: Diagnosis not present

## 2019-08-31 DIAGNOSIS — E7800 Pure hypercholesterolemia, unspecified: Secondary | ICD-10-CM | POA: Insufficient documentation

## 2019-08-31 DIAGNOSIS — Z79899 Other long term (current) drug therapy: Secondary | ICD-10-CM | POA: Diagnosis not present

## 2019-08-31 DIAGNOSIS — I441 Atrioventricular block, second degree: Secondary | ICD-10-CM

## 2019-08-31 HISTORY — PX: CATARACT EXTRACTION W/PHACO: SHX586

## 2019-08-31 HISTORY — DX: Presence of dental prosthetic device (complete) (partial): Z97.2

## 2019-08-31 HISTORY — DX: Gout, unspecified: M10.9

## 2019-08-31 LAB — GLUCOSE, CAPILLARY
Glucose-Capillary: 132 mg/dL — ABNORMAL HIGH (ref 70–99)
Glucose-Capillary: 129 mg/dL — ABNORMAL HIGH (ref 70–99)

## 2019-08-31 SURGERY — PHACOEMULSIFICATION, CATARACT, WITH IOL INSERTION
Anesthesia: Topical | Site: Eye | Laterality: Left

## 2019-08-31 MED ORDER — EPINEPHRINE PF 1 MG/ML IJ SOLN
INTRAOCULAR | Status: DC | PRN
  Administered 2019-08-31: 51 mL via OPHTHALMIC

## 2019-08-31 MED ORDER — FENTANYL CITRATE (PF) 100 MCG/2ML IJ SOLN
INTRAMUSCULAR | Status: DC | PRN
  Administered 2019-08-31: 50 ug via INTRAVENOUS

## 2019-08-31 MED ORDER — MOXIFLOXACIN HCL 0.5 % OP SOLN
OPHTHALMIC | Status: DC | PRN
  Administered 2019-08-31: 0.2 mL via OPHTHALMIC

## 2019-08-31 MED ORDER — ARMC OPHTHALMIC DILATING DROPS
1.0000 | OPHTHALMIC | Status: DC | PRN
Start: 2019-08-31 — End: 2019-08-31
  Administered 2019-08-31 (×3): 1 via OPHTHALMIC

## 2019-08-31 MED ORDER — NA CHONDROIT SULF-NA HYALURON 40-17 MG/ML IO SOLN
INTRAOCULAR | Status: DC | PRN
  Administered 2019-08-31: 1 mL via INTRAOCULAR

## 2019-08-31 MED ORDER — TETRACAINE HCL 0.5 % OP SOLN
1.0000 [drp] | OPHTHALMIC | Status: DC | PRN
  Administered 2019-08-31 (×4): 1 [drp] via OPHTHALMIC

## 2019-08-31 MED ORDER — BRIMONIDINE TARTRATE-TIMOLOL 0.2-0.5 % OP SOLN
OPHTHALMIC | Status: DC | PRN
  Administered 2019-08-31: 1 [drp] via OPHTHALMIC

## 2019-08-31 MED ORDER — LIDOCAINE HCL (PF) 2 % IJ SOLN
INTRAOCULAR | Status: DC | PRN
  Administered 2019-08-31: 1 mL

## 2019-08-31 SURGICAL SUPPLY — 20 items
CANNULA ANT/CHMB 27G (MISCELLANEOUS) ×2 IMPLANT
CANNULA ANT/CHMB 27GA (MISCELLANEOUS) ×6 IMPLANT
GLOVE SURG LX 8.0 MICRO (GLOVE) ×2
GLOVE SURG LX STRL 8.0 MICRO (GLOVE) ×1 IMPLANT
GLOVE SURG TRIUMPH 8.0 PF LTX (GLOVE) ×3 IMPLANT
GOWN STRL REUS W/ TWL LRG LVL3 (GOWN DISPOSABLE) ×2 IMPLANT
GOWN STRL REUS W/TWL LRG LVL3 (GOWN DISPOSABLE) ×6
LENS IOL DIOP 21.0 (Intraocular Lens) ×3 IMPLANT
LENS IOL TECNIS MONO 21.0 (Intraocular Lens) IMPLANT
MARKER SKIN DUAL TIP RULER LAB (MISCELLANEOUS) ×3 IMPLANT
NDL FILTER BLUNT 18X1 1/2 (NEEDLE) ×1 IMPLANT
NEEDLE FILTER BLUNT 18X 1/2SAF (NEEDLE) ×2
NEEDLE FILTER BLUNT 18X1 1/2 (NEEDLE) ×1 IMPLANT
PACK EYE AFTER SURG (MISCELLANEOUS) ×3 IMPLANT
PACK OPTHALMIC (MISCELLANEOUS) ×3 IMPLANT
PACK PORFILIO (MISCELLANEOUS) ×3 IMPLANT
SYR 3ML LL SCALE MARK (SYRINGE) ×3 IMPLANT
SYR TB 1ML LUER SLIP (SYRINGE) ×3 IMPLANT
WATER STERILE IRR 250ML POUR (IV SOLUTION) ×3 IMPLANT
WIPE NON LINTING 3.25X3.25 (MISCELLANEOUS) ×3 IMPLANT

## 2019-08-31 NOTE — H&P (Signed)
All labs reviewed. Abnormal studies sent to patients PCP when indicated.  Previous H&P reviewed, patient examined, there are NO CHANGES.  Ronnie Mullaly Porfilio8/10/20219:52 AM

## 2019-08-31 NOTE — Transfer of Care (Signed)
Immediate Anesthesia Transfer of Care Note  Patient: Ronnie Lawrence  Procedure(s) Performed: CATARACT EXTRACTION PHACO AND INTRAOCULAR LENS PLACEMENT (IOC) LEFT DIABETIC 9.58 00:51.0 (Left Eye)  Patient Location: PACU  Anesthesia Type: No value filed.  Level of Consciousness: awake, alert  and patient cooperative  Airway and Oxygen Therapy: Patient Spontanous Breathing and Patient connected to supplemental oxygen  Post-op Assessment: Post-op Vital signs reviewed, Patient's Cardiovascular Status Stable, Respiratory Function Stable, Patent Airway and No signs of Nausea or vomiting  Post-op Vital Signs: Reviewed and stable  Complications: No complications documented.

## 2019-08-31 NOTE — Anesthesia Procedure Notes (Signed)
Procedure Name: MAC Date/Time: 08/31/2019 10:00 AM Performed by: Cameron Ali, CRNA Pre-anesthesia Checklist: Patient identified, Emergency Drugs available, Suction available, Timeout performed and Patient being monitored Patient Re-evaluated:Patient Re-evaluated prior to induction Oxygen Delivery Method: Nasal cannula Placement Confirmation: positive ETCO2

## 2019-08-31 NOTE — Op Note (Signed)
PREOPERATIVE DIAGNOSIS:  Nuclear sclerotic cataract of the left eye.   POSTOPERATIVE DIAGNOSIS:  Nuclear sclerotic cataract of the left eye.   OPERATIVE PROCEDURE:@   SURGEON:  Birder Robson, MD.   ANESTHESIA:  Anesthesiologist: Page, Adele Barthel, MD CRNA: Cameron Ali, CRNA  1.      Managed anesthesia care. 2.     0.18ml of Shugarcaine was instilled following the paracentesis   COMPLICATIONS:  None.   TECHNIQUE:   Stop and chop   DESCRIPTION OF PROCEDURE:  The patient was examined and consented in the preoperative holding area where the aforementioned topical anesthesia was applied to the left eye and then brought back to the Operating Room where the left eye was prepped and draped in the usual sterile ophthalmic fashion and a lid speculum was placed. A paracentesis was created with the side port blade and the anterior chamber was filled with viscoelastic. A near clear corneal incision was performed with the steel keratome. A continuous curvilinear capsulorrhexis was performed with a cystotome followed by the capsulorrhexis forceps. Hydrodissection and hydrodelineation were carried out with BSS on a blunt cannula. The lens was removed in a stop and chop  technique and the remaining cortical material was removed with the irrigation-aspiration handpiece. The capsular bag was inflated with viscoelastic and the Technis [id number] lens was placed in the capsular bag without complication. The remaining viscoelastic was removed from the eye with the irrigation-aspiration handpiece. The wounds were hydrated. The anterior chamber was flushed with BSS and the eye was inflated to physiologic pressure. 0.70ml Vigamox was placed in the anterior chamber. The wounds were found to be water tight. The eye was dressed with Combigan. The patient was given protective glasses to wear throughout the day and a shield with which to sleep tonight. The patient was also given drops with which to begin a drop regimen today and  will follow-up with me in one day. Implant Name Type Inv. Item Serial No. Manufacturer Lot No. LRB No. Used Action  LENS IOL DIOP 21.0 - [id number] Intraocular Lens LENS IOL DIOP 21.0 [id number] [employer]  Left 1 Implanted    Procedure(s) with comments: CATARACT EXTRACTION PHACO AND INTRAOCULAR LENS PLACEMENT (IOC) LEFT DIABETIC 9.58 00:51.0 (Left) - Diabetic - oral meds  Electronically signed: Birder Robson 08/31/2019 10:18 AM

## 2019-08-31 NOTE — Anesthesia Postprocedure Evaluation (Signed)
Anesthesia Post Note  Patient: Ronnie Lawrence  Procedure(s) Performed: CATARACT EXTRACTION PHACO AND INTRAOCULAR LENS PLACEMENT (IOC) LEFT DIABETIC 9.58 00:51.0 (Left Eye)     Patient location during evaluation: PACU Anesthesia Type: MAC Level of consciousness: awake and alert Pain management: pain level controlled Vital Signs Assessment: post-procedure vital signs reviewed and stable Respiratory status: spontaneous breathing Cardiovascular status: blood pressure returned to baseline Postop Assessment: no apparent nausea or vomiting, adequate PO intake and no headache Anesthetic complications: no   No complications documented.  Ronnie Lawrence Ronnie Lawrence   EKG obtained. Rhythm strips from OR collected as well and given to patient to bring to his PCP for them to evaluate

## 2019-08-31 NOTE — Anesthesia Preprocedure Evaluation (Signed)
Anesthesia Evaluation  Patient identified by MRN, date of birth, ID band Patient awake    History of Anesthesia Complications Negative for: history of anesthetic complications  Airway Mallampati: I  TM Distance: >3 FB Neck ROM: Full    Dental no notable dental hx.    Pulmonary former smoker,    Pulmonary exam normal        Cardiovascular negative cardio ROS Normal cardiovascular exam     Neuro/Psych Hard of hearing    GI/Hepatic negative GI ROS, Neg liver ROS,   Endo/Other  diabetes  Renal/GU negative Renal ROS     Musculoskeletal   Abdominal   Peds  Hematology negative hematology ROS (+)   Anesthesia Other Findings   Reproductive/Obstetrics                             Anesthesia Physical Anesthesia Plan  ASA: II  Anesthesia Plan:    Post-op Pain Management:    Induction:   PONV Risk Score and Plan: 1 and TIVA and Treatment may vary due to age or medical condition  Airway Management Planned: Nasal Cannula and Natural Airway  Additional Equipment: None  Intra-op Plan:   Post-operative Plan:   Informed Consent: I have reviewed the patients History and Physical, chart, labs and discussed the procedure including the risks, benefits and alternatives for the proposed anesthesia with the patient or authorized representative who has indicated his/her understanding and acceptance.       Plan Discussed with: CRNA  Anesthesia Plan Comments:         Anesthesia Quick Evaluation

## 2019-09-03 ENCOUNTER — Telehealth: Payer: Self-pay | Admitting: Internal Medicine

## 2019-09-03 NOTE — Telephone Encounter (Signed)
EKG performed following cataract surgery earlier this week for evaluation of perioperative bradycardia showed 2:1 AV block.  Case was discussed with Dr. George Ina (ophthomalogy) and Dr. Wynelle Cleveland (anesthesia) that night and on Wednesday.  Patient seen for post-op follow-up by Dr. George Ina, who contacted me and reported that the patient would like to arrange for cardiology evaluation ASAP.  We will arrange for consultation in our office next week.  Nelva Bush, MD [facility]

## 2019-09-06 NOTE — Telephone Encounter (Signed)
Thanks you!

## 2019-09-06 NOTE — Telephone Encounter (Signed)
Attempted to call patient no answer or VM on one number. Mobile number also not available at this time.  Per scheduler, Appointment this Wednesday, the 18th at 9:20 or 10:00.  Will need to be attempted to call later.

## 2019-09-06 NOTE — Telephone Encounter (Signed)
Appointment has been made for Wednesday with Ronnie Lawrence. A referral has been made as well, no clinical information was documented, just an [place]

## 2019-09-08 ENCOUNTER — Encounter: Payer: Self-pay | Admitting: Cardiology

## 2019-09-08 ENCOUNTER — Other Ambulatory Visit: Payer: Self-pay

## 2019-09-08 ENCOUNTER — Ambulatory Visit (INDEPENDENT_AMBULATORY_CARE_PROVIDER_SITE_OTHER): Payer: Medicare Other | Admitting: Cardiology

## 2019-09-08 ENCOUNTER — Ambulatory Visit (INDEPENDENT_AMBULATORY_CARE_PROVIDER_SITE_OTHER): Payer: Medicare Other

## 2019-09-08 VITALS — BP 142/82 | HR 79 | Ht 69.0 in | Wt 187.8 lb

## 2019-09-08 DIAGNOSIS — I441 Atrioventricular block, second degree: Secondary | ICD-10-CM | POA: Diagnosis not present

## 2019-09-08 DIAGNOSIS — H269 Unspecified cataract: Secondary | ICD-10-CM | POA: Insufficient documentation

## 2019-09-08 NOTE — Patient Instructions (Addendum)
Medication Instructions:  Your physician recommends that you continue on your current medications as directed. Please refer to the Current Medication list given to you today.  Labwork: None ordered.  Testing/Procedures: Your physician has recommended that you wear a holter monitor. Holter monitors are medical devices that record the heart's electrical activity.   You will wear your heart monitor for 14 days.  Take it off on September 22, 2019 and return by mail   Follow-Up: Your physician wants you to follow-up in: 3 months with Dr. Quentin Ore.      Any Other Special Instructions Will Be Listed Below (If Applicable).  If you need a refill on your cardiac medications before your next appointment, please call your pharmacy.

## 2019-09-08 NOTE — Progress Notes (Signed)
Cardiology Office Note:    Date:  09/08/2019   ID:  AKRAM KISSICK, DOB 21-Jan-1928, MRN [id number]  PCP:  [name], MD  [place] Cardiologist:  No primary care provider on file.  [facility] Electrophysiologist:  None   Referring MD: [name], MD   Abnormal ECG  History of Present Illness:    Ronnie Lawrence is a 86 y.o. male with a hx of cataracts, DM, HTN who presents for evaluation after an abnormal ECG. Patient denies any history of palpitations, syncope, presyncope.   Past Medical History:  Diagnosis Date  . Asthma   . Diabetes mellitus   . Gout   . Hypertension   . Wears dentures    partial upper    Past Surgical History:  Procedure Laterality Date  . BACK SURGERY    . CATARACT EXTRACTION W/PHACO Left 08/31/2019   Procedure: CATARACT EXTRACTION PHACO AND INTRAOCULAR LENS PLACEMENT (IOC) LEFT DIABETIC 9.58 00:51.0;  Surgeon: Birder Robson, MD;  Location: [place];  Service: Ophthalmology;  Laterality: Left;  Diabetic - oral meds  . COLONOSCOPY N/A 08/13/2013   Procedure: COLONOSCOPY;  Surgeon: Rogene Houston, MD;  [facility];  Service: Endoscopy;  Laterality: N/A;  1050  . HERNIA REPAIR     bilateral    Current Medications: Current Meds  Medication Sig  . aspirin EC 81 MG tablet Take 81 mg by mouth daily.    Marland Kitchen atorvastatin (LIPITOR) 10 MG tablet Take 10 mg by mouth at bedtime.    . magnesium gluconate (MAGONATE) 500 MG tablet Take 500 mg by mouth daily.  . metFORMIN (GLUCOPHAGE-XR) 500 MG 24 hr tablet Take 500 mg by mouth 2 (two) times daily.   . Multiple Vitamin (MULTIVITAMIN) tablet Take 1 tablet by mouth daily.     Allergies:   Patient has no known allergies.   Social History   Socioeconomic History  . Marital status: Widowed    Spouse name: Not on file  . Number of children: Not on file  . Years of education: Not on file  . Highest education level: Not on file  Occupational History  . Not on file  Tobacco  Use  . Smoking status: Former Smoker    Quit date: 1981    Years since quitting: 40.6  . Smokeless tobacco: Never Used  Vaping Use  . Vaping Use: Never used  Substance and Sexual Activity  . Alcohol use: Yes    Alcohol/week: 7.0 standard drinks    Types: 7 Standard drinks or equivalent per week  . Drug use: No  . Sexual activity: Not on file  Other Topics Concern  . Not on file  Social History Narrative  . Not on file   Social Determinants of Health   Financial Resource Strain:   . Difficulty of Paying Living Expenses:   Food Insecurity:   . Worried About Charity fundraiser in the Last Year:   . Arboriculturist in the Last Year:   Transportation Needs:   . Film/video editor (Medical):   Marland Kitchen Lack of Transportation (Non-Medical):   Physical Activity:   . Days of Exercise per Week:   . Minutes of Exercise per Session:   Stress:   . Feeling of Stress :   Social Connections:   . Frequency of Communication with Friends and Family:   . Frequency of Social Gatherings with Friends and Family:   . Attends Religious Services:   . Active  Member of Clubs or Organizations:   . Attends Archivist Meetings:   Marland Kitchen Marital Status:      Family History: The patient's family history is not on file.  ROS:   Please see the history of present illness.    All other systems reviewed and are negative.  EKGs/Labs/Other Studies Reviewed:    The following studies were reviewed today: ECG  EKG:  EKG is  ordered today.  The ekg ordered today demonstrates wenckebach av conduction with RBBB QRS.  Recent Labs: No results found for requested labs within last 8760 hours.  Recent Lipid Panel No results found for: CHOL, TRIG, HDL, CHOLHDL, VLDL, LDLCALC, LDLDIRECT  Physical Exam:    VS:  BP (!) 142/82   Pulse 79   Ht 5\' 9"  (1.753 m)   Wt 187 lb 12.8 oz (85.2 kg)   SpO2 95%   BMI 27.73 kg/m     Wt Readings from Last 3 Encounters:  09/08/19 187 lb 12.8 oz (85.2 kg)    08/31/19 188 lb (85.3 kg)  08/13/13 190 lb (86.2 kg)     GEN: no acute distress HEENT: Normal NECK: No JVD; No carotid bruits LYMPHATICS: No lymphadenopathy CARDIAC: RRR, no murmurs, rubs, gallops RESPIRATORY:  Clear to auscultation without rales, wheezing or rhonchi  ABDOMEN: Soft, non-tender, non-distended MUSCULOSKELETAL:  No edema; No deformity  SKIN: Warm and dry NEUROLOGIC:  Alert and oriented x 3 PSYCHIATRIC:  Normal affect   ASSESSMENT:    1. Atrioventricular block, Mobitz type 1, Wenckebach    PLAN:    In order of problems listed above:  1. 2nd degree AV block, Mobitz I  This appears to be AV nodal disease. No history concerning for high degree heart block (no syncope, presyncope). Given this history and today's ECG, I feel that it is reasonable to continue with plan for cataract surgery later this month. In the interim, will apply Zio patch to assess for any evidence of higher degree AV block. Follow up 3 months after cataract procedure.    Medication Adjustments/Labs and Tests Ordered: Current medicines are reviewed at length with the patient today.  Concerns regarding medicines are outlined above.  Orders Placed This Encounter  Procedures  . LONG TERM MONITOR (3-14 DAYS)  . EKG 12-Lead   No orders of the defined types were placed in this encounter.   Patient Instructions  Medication Instructions:  Your physician recommends that you continue on your current medications as directed. Please refer to the Current Medication list given to you today.  Labwork: None ordered.  Testing/Procedures: Your physician has recommended that you wear a holter monitor. Holter monitors are medical devices that record the heart's electrical activity.   You will wear your heart monitor for 14 days.  Take it off on September 22, 2019 and return by mail   Follow-Up: Your physician wants you to follow-up in: 3 months with Dr. Quentin Ore.      Any Other Special Instructions  Will Be Listed Below (If Applicable).  If you need a refill on your cardiac medications before your next appointment, please call your pharmacy.      Signed, Vickie Epley, MD  09/08/2019 11:00 AM    [facility]

## 2019-09-17 ENCOUNTER — Other Ambulatory Visit: Payer: Self-pay

## 2019-09-17 ENCOUNTER — Other Ambulatory Visit
Admission: RE | Admit: 2019-09-17 | Discharge: 2019-09-17 | Disposition: A | Discharge status: Home / Self Care | Payer: Medicare Other | Source: Ambulatory Visit | Attending: Ophthalmology | Admitting: Ophthalmology

## 2019-09-17 DIAGNOSIS — Z20822 Contact with and (suspected) exposure to covid-19: Secondary | ICD-10-CM | POA: Diagnosis not present

## 2019-09-17 DIAGNOSIS — Z01812 Encounter for preprocedural laboratory examination: Secondary | ICD-10-CM | POA: Diagnosis present

## 2019-09-17 LAB — SARS CORONAVIRUS 2 (TAT 6-24 HRS): SARS Coronavirus 2: NEGATIVE

## 2019-09-20 NOTE — Discharge Instructions (Signed)

## 2019-09-21 ENCOUNTER — Ambulatory Visit: Payer: Medicare Other | Admitting: Anesthesiology

## 2019-09-21 ENCOUNTER — Other Ambulatory Visit: Payer: Self-pay

## 2019-09-21 ENCOUNTER — Encounter: Payer: Self-pay | Admitting: Ophthalmology

## 2019-09-21 ENCOUNTER — Encounter
Admission: RE | Disposition: A | Discharge status: Home / Self Care | Payer: Self-pay | Source: Home / Self Care | Attending: Ophthalmology

## 2019-09-21 ENCOUNTER — Ambulatory Visit
Admission: RE | Admit: 2019-09-21 | Discharge: 2019-09-21 | Disposition: A | Discharge status: Home / Self Care | Payer: Medicare Other | Attending: Ophthalmology | Admitting: Ophthalmology

## 2019-09-21 DIAGNOSIS — J45909 Unspecified asthma, uncomplicated: Secondary | ICD-10-CM | POA: Insufficient documentation

## 2019-09-21 DIAGNOSIS — I10 Essential (primary) hypertension: Secondary | ICD-10-CM | POA: Diagnosis not present

## 2019-09-21 DIAGNOSIS — Z79899 Other long term (current) drug therapy: Secondary | ICD-10-CM | POA: Diagnosis not present

## 2019-09-21 DIAGNOSIS — E7800 Pure hypercholesterolemia, unspecified: Secondary | ICD-10-CM | POA: Diagnosis not present

## 2019-09-21 DIAGNOSIS — H2511 Age-related nuclear cataract, right eye: Secondary | ICD-10-CM | POA: Diagnosis not present

## 2019-09-21 DIAGNOSIS — Z87891 Personal history of nicotine dependence: Secondary | ICD-10-CM | POA: Insufficient documentation

## 2019-09-21 DIAGNOSIS — Z7982 Long term (current) use of aspirin: Secondary | ICD-10-CM | POA: Diagnosis not present

## 2019-09-21 DIAGNOSIS — Z7984 Long term (current) use of oral hypoglycemic drugs: Secondary | ICD-10-CM | POA: Diagnosis not present

## 2019-09-21 DIAGNOSIS — Z9849 Cataract extraction status, unspecified eye: Secondary | ICD-10-CM | POA: Diagnosis not present

## 2019-09-21 DIAGNOSIS — E1136 Type 2 diabetes mellitus with diabetic cataract: Secondary | ICD-10-CM | POA: Insufficient documentation

## 2019-09-21 HISTORY — PX: CATARACT EXTRACTION W/PHACO: SHX586

## 2019-09-21 LAB — GLUCOSE, CAPILLARY: Glucose-Capillary: 140 mg/dL — ABNORMAL HIGH (ref 70–99)

## 2019-09-21 SURGERY — PHACOEMULSIFICATION, CATARACT, WITH IOL INSERTION
Anesthesia: Monitor Anesthesia Care | Site: Eye | Laterality: Right

## 2019-09-21 MED ORDER — ACETAMINOPHEN 325 MG PO TABS: 325.0000 mg | ORAL_TABLET | Freq: Once | ORAL | Status: DC

## 2019-09-21 MED ORDER — MOXIFLOXACIN HCL 0.5 % OP SOLN
OPHTHALMIC | Status: DC | PRN
  Administered 2019-09-21: 0.2 mL via OPHTHALMIC

## 2019-09-21 MED ORDER — EPINEPHRINE PF 1 MG/ML IJ SOLN
INTRAOCULAR | Status: DC | PRN
  Administered 2019-09-21: 60 mL via OPHTHALMIC

## 2019-09-21 MED ORDER — LIDOCAINE HCL (PF) 2 % IJ SOLN
INTRAOCULAR | Status: DC | PRN
  Administered 2019-09-21: 1 mL

## 2019-09-21 MED ORDER — FENTANYL CITRATE (PF) 100 MCG/2ML IJ SOLN
INTRAMUSCULAR | Status: DC | PRN
Start: 2019-09-21 — End: 2019-09-21
  Administered 2019-09-21: 50 ug via INTRAVENOUS

## 2019-09-21 MED ORDER — GLYCOPYRROLATE 0.2 MG/ML IJ SOLN
INTRAMUSCULAR | Status: DC | PRN
  Administered 2019-09-21: .2 mg via INTRAVENOUS

## 2019-09-21 MED ORDER — ACETAMINOPHEN 160 MG/5ML PO SOLN: 325.0000 mg | Freq: Once | ORAL | Status: DC

## 2019-09-21 MED ORDER — BRIMONIDINE TARTRATE-TIMOLOL 0.2-0.5 % OP SOLN
OPHTHALMIC | Status: DC | PRN
  Administered 2019-09-21: 1 [drp] via OPHTHALMIC

## 2019-09-21 MED ORDER — NA CHONDROIT SULF-NA HYALURON 40-17 MG/ML IO SOLN
INTRAOCULAR | Status: DC | PRN
  Administered 2019-09-21: 1 mL via INTRAOCULAR

## 2019-09-21 MED ORDER — LACTATED RINGERS IV SOLN: INTRAVENOUS | Status: DC

## 2019-09-21 MED ORDER — ARMC OPHTHALMIC DILATING DROPS
1.0000 "application " | OPHTHALMIC | Status: DC | PRN
  Administered 2019-09-21 (×3): 1 via OPHTHALMIC

## 2019-09-21 MED ORDER — TETRACAINE HCL 0.5 % OP SOLN
1.0000 [drp] | OPHTHALMIC | Status: DC | PRN
  Administered 2019-09-21 (×3): 1 [drp] via OPHTHALMIC

## 2019-09-21 SURGICAL SUPPLY — 19 items
CANNULA ANT/CHMB 27GA (MISCELLANEOUS) ×6 IMPLANT
GLOVE SURG LX 8.0 MICRO (GLOVE) ×2
GLOVE SURG LX STRL 8.0 MICRO (GLOVE) ×1 IMPLANT
GLOVE SURG TRIUMPH 8.0 PF LTX (GLOVE) ×3 IMPLANT
GOWN STRL REUS W/ TWL LRG LVL3 (GOWN DISPOSABLE) ×2 IMPLANT
GOWN STRL REUS W/TWL LRG LVL3 (GOWN DISPOSABLE) ×6
LENS IOL DIOP 21.0 (Intraocular Lens) ×3 IMPLANT
LENS IOL TECNIS MONO 21.0 (Intraocular Lens) ×1 IMPLANT
MARKER SKIN DUAL TIP RULER LAB (MISCELLANEOUS) ×3 IMPLANT
NEEDLE FILTER BLUNT 18X 1/2SAF (NEEDLE) ×2
NEEDLE FILTER BLUNT 18X1 1/2 (NEEDLE) ×1 IMPLANT
PACK EYE AFTER SURG (MISCELLANEOUS) ×3 IMPLANT
PACK OPTHALMIC (MISCELLANEOUS) ×3 IMPLANT
PACK PORFILIO (MISCELLANEOUS) ×3 IMPLANT
RING MALYGIN (MISCELLANEOUS) ×3 IMPLANT
SYR 3ML LL SCALE MARK (SYRINGE) ×3 IMPLANT
SYR TB 1ML LUER SLIP (SYRINGE) ×3 IMPLANT
WATER STERILE IRR 250ML POUR (IV SOLUTION) ×3 IMPLANT
WIPE NON LINTING 3.25X3.25 (MISCELLANEOUS) ×3 IMPLANT

## 2019-09-21 NOTE — Anesthesia Procedure Notes (Signed)
Procedure Name: MAC Date/Time: 09/21/2019 9:51 AM Performed by: Jeannene Patella, CRNA Pre-anesthesia Checklist: Patient identified, Emergency Drugs available, Suction available, Timeout performed and Patient being monitored Patient Re-evaluated:Patient Re-evaluated prior to induction Oxygen Delivery Method: Nasal cannula Placement Confirmation: positive ETCO2

## 2019-09-21 NOTE — Anesthesia Preprocedure Evaluation (Signed)
Anesthesia Evaluation  Patient identified by MRN, date of birth, ID band Patient awake    History of Anesthesia Complications Negative for: history of anesthetic complications  Airway Mallampati: I  TM Distance: >3 FB Neck ROM: Full    Dental no notable dental hx.    Pulmonary former smoker,    Pulmonary exam normal        Cardiovascular hypertension, Normal cardiovascular exam+ dysrhythmias      Neuro/Psych Hard of hearing    GI/Hepatic negative GI ROS, Neg liver ROS,   Endo/Other  diabetes  Renal/GU negative Renal ROS     Musculoskeletal   Abdominal   Peds  Hematology negative hematology ROS (+)   Anesthesia Other Findings   Reproductive/Obstetrics                             Anesthesia Physical  Anesthesia Plan  ASA: III  Anesthesia Plan: MAC   Post-op Pain Management:    Induction:   PONV Risk Score and Plan: 1 and TIVA and Treatment may vary due to age or medical condition  Airway Management Planned: Nasal Cannula and Natural Airway  Additional Equipment: None  Intra-op Plan:   Post-operative Plan:   Informed Consent: I have reviewed the patients History and Physical, chart, labs and discussed the procedure including the risks, benefits and alternatives for the proposed anesthesia with the patient or authorized representative who has indicated his/her understanding and acceptance.       Plan Discussed with: CRNA  Anesthesia Plan Comments:         Anesthesia Quick Evaluation

## 2019-09-21 NOTE — Transfer of Care (Signed)
Immediate Anesthesia Transfer of Care Note  Patient: Ronnie Lawrence  Procedure(s) Performed: CATARACT EXTRACTION PHACO AND INTRAOCULAR LENS PLACEMENT (IOC) RIGHT DIABETIC 13.56  01:10.7 (Right Eye)  Patient Location: PACU  Anesthesia Type: MAC  Level of Consciousness: awake, alert  and patient cooperative  Airway and Oxygen Therapy: Patient Spontanous Breathing and Patient connected to supplemental oxygen  Post-op Assessment: Post-op Vital signs reviewed, Patient's Cardiovascular Status Stable, Respiratory Function Stable, Patent Airway and No signs of Nausea or vomiting  Post-op Vital Signs: Reviewed and stable  Complications: No complications documented.

## 2019-09-21 NOTE — Anesthesia Postprocedure Evaluation (Signed)
Anesthesia Post Note  Patient: Ronnie Lawrence  Procedure(s) Performed: CATARACT EXTRACTION PHACO AND INTRAOCULAR LENS PLACEMENT (IOC) RIGHT DIABETIC 13.56  01:10.7 (Right Eye)     Patient location during evaluation: PACU Anesthesia Type: MAC Level of consciousness: awake and alert and oriented Pain management: satisfactory to patient Vital Signs Assessment: post-procedure vital signs reviewed and stable Respiratory status: spontaneous breathing, nonlabored ventilation and respiratory function stable Cardiovascular status: blood pressure returned to baseline and stable Postop Assessment: Adequate PO intake and No signs of nausea or vomiting Anesthetic complications: no   No complications documented.  Raliegh Ip

## 2019-09-21 NOTE — H&P (Signed)
All labs reviewed. Abnormal studies sent to patients PCP when indicated.  Previous H&P reviewed, patient examined, there are NO CHANGES.  Ronnie Capek Porfilio8/31/20219:38 AM

## 2019-09-21 NOTE — Op Note (Signed)
PREOPERATIVE DIAGNOSIS:  Nuclear sclerotic cataract of the right eye.   POSTOPERATIVE DIAGNOSIS:  H25.11 Cataract   OPERATIVE PROCEDURE:@   SURGEON:  Birder Robson, MD.   ANESTHESIA:  Anesthesiologist: Ronelle Nigh, MD CRNA: Jeannene Patella, CRNA  1.      Managed anesthesia care. 2.      0.26ml of Shugarcaine was instilled in the eye following the paracentesis.   COMPLICATIONS: Viscoelastic was used to raise the pupil margin.  A  Malyugin ring was placed as the pupil would not achieve sufficient pharmacologic dilation to undergo cataract extraction safely.( The ring was removed atraumatically following insertion of the IOL.)    TECHNIQUE:   Stop and chop   DESCRIPTION OF PROCEDURE:  The patient was examined and consented in the preoperative holding area where the aforementioned topical anesthesia was applied to the right eye and then brought back to the Operating Room where the right eye was prepped and draped in the usual sterile ophthalmic fashion and a lid speculum was placed. A paracentesis was created with the side port blade and the anterior chamber was filled with viscoelastic. A near clear corneal incision was performed with the steel keratome. A continuous curvilinear capsulorrhexis was performed with a cystotome followed by the capsulorrhexis forceps. Hydrodissection and hydrodelineation were carried out with BSS on a blunt cannula. The lens was removed in a stop and chop  technique and the remaining cortical material was removed with the irrigation-aspiration handpiece. The capsular bag was inflated with viscoelastic and the Technis [id number]  lens was placed in the capsular bag without complication. The remaining viscoelastic was removed from the eye with the irrigation-aspiration handpiece. The wounds were hydrated. The anterior chamber was flushed with BSS and the eye was inflated to physiologic pressure. 0.68ml of Vigamox was placed in the anterior chamber. The wounds were  found to be water tight. The eye was dressed with Combigan. The patient was given protective glasses to wear throughout the day and a shield with which to sleep tonight. The patient was also given drops with which to begin a drop regimen today and will follow-up with me in one day. Implant Name Type Inv. Item Serial No. Manufacturer Lot No. LRB No. Used Action  LENS IOL DIOP 21.0 - [id number] Intraocular Lens LENS IOL DIOP 21.0 [id number] [employer]  Right 1 Implanted   Procedure(s): CATARACT EXTRACTION PHACO AND INTRAOCULAR LENS PLACEMENT (IOC) RIGHT DIABETIC 13.56  01:10.7 (Right)  Electronically signed: Birder Robson 09/21/2019 10:09 AM

## 2019-09-30 ENCOUNTER — Telehealth: Payer: Self-pay | Admitting: Cardiology

## 2019-09-30 ENCOUNTER — Telehealth: Payer: Self-pay

## 2019-09-30 DIAGNOSIS — I441 Atrioventricular block, second degree: Secondary | ICD-10-CM

## 2019-09-30 NOTE — Telephone Encounter (Signed)
Received a call from Nicolette at [facility] about about patients monitor.  She stated that the patient had an episode of complete HB at 37 bpm, 2nd degree Mobitz One with a rate between 36-72 on 09/21/19. Also Complete HB at 27 bpm, 2nd degree Mobitz One with a rate between 26-53 bpm.  Dr. Quentin Ore has been made aware by Sadie Haber, [place].

## 2019-09-30 NOTE — Telephone Encounter (Signed)
Brief EP Note Notified about critical Zio results. Personally reviewed zio results which demonstrated episodes of high degree AV block with ventricular rates in the 20s during awake hours. Minimum heart rate was 21bpm during the monitoring period. I have called and spoken with the patient and recommended a DDD PPM. Would recommend we proceed with permanent pacemaker prior to any elective surgeries to reduce the risk of perioperative complications. Our office will contact the patient for scheduling.     Lysbeth Galas T. Quentin Ore, MD, [facility] Cardiac Electrophysiology

## 2019-10-01 ENCOUNTER — Telehealth: Payer: Self-pay | Admitting: Cardiology

## 2019-10-01 NOTE — Telephone Encounter (Signed)
Call placed to daughter.  Questions answered about condition and alternatives.  Agreeable to pacemaker implant on 10/11/2019 at 11:30 am   Will get lab work and covid test at [place] on 9/17- need to schedule

## 2019-10-01 NOTE — Telephone Encounter (Signed)
Ronnie Lawrence is calling stating she is waiting on a call from Dr. Mardene Speak nurse. She states she advised she would call around 3:00 PM and they have not heard anything. Please advise.

## 2019-10-05 NOTE — Addendum Note (Signed)
Addended by: Willeen Cass A on: 10/05/2019 08:09 AM   Modules accepted: Orders

## 2019-10-05 NOTE — Telephone Encounter (Signed)
Work up complete.  Will mail instruction letter.

## 2019-10-07 ENCOUNTER — Other Ambulatory Visit: Admission: RE | Admit: 2019-10-07 | Payer: Medicare Other | Source: Ambulatory Visit

## 2019-10-08 ENCOUNTER — Other Ambulatory Visit
Admission: RE | Admit: 2019-10-08 | Discharge: 2019-10-08 | Disposition: A | Discharge status: Home / Self Care | Payer: Medicare Other | Attending: Cardiology | Admitting: Cardiology

## 2019-10-08 ENCOUNTER — Other Ambulatory Visit
Admission: RE | Admit: 2019-10-08 | Discharge: 2019-10-08 | Disposition: A | Discharge status: Home / Self Care | Payer: Medicare Other | Source: Ambulatory Visit | Attending: Cardiology | Admitting: Cardiology

## 2019-10-08 ENCOUNTER — Other Ambulatory Visit: Payer: Self-pay

## 2019-10-08 DIAGNOSIS — Z01812 Encounter for preprocedural laboratory examination: Secondary | ICD-10-CM | POA: Diagnosis present

## 2019-10-08 DIAGNOSIS — I441 Atrioventricular block, second degree: Secondary | ICD-10-CM

## 2019-10-08 DIAGNOSIS — Z20822 Contact with and (suspected) exposure to covid-19: Secondary | ICD-10-CM | POA: Diagnosis not present

## 2019-10-08 LAB — CBC WITH DIFFERENTIAL/PLATELET
Abs Immature Granulocytes: 0.05 10*3/uL (ref 0.00–0.07)
Basophils Absolute: 0.1 10*3/uL (ref 0.0–0.1)
Basophils Relative: 1 %
Eosinophils Absolute: 0.2 10*3/uL (ref 0.0–0.5)
Eosinophils Relative: 2 %
HCT: 48.6 % (ref 39.0–52.0)
Hemoglobin: 16.4 g/dL (ref 13.0–17.0)
Immature Granulocytes: 1 %
Lymphocytes Relative: 27 %
Lymphs Abs: 2.4 10*3/uL (ref 0.7–4.0)
MCH: 33.1 pg (ref 26.0–34.0)
MCHC: 33.7 g/dL (ref 30.0–36.0)
MCV: 98 fL (ref 80.0–100.0)
Monocytes Absolute: 0.9 10*3/uL (ref 0.1–1.0)
Monocytes Relative: 10 %
Neutro Abs: 5.3 10*3/uL (ref 1.7–7.7)
Neutrophils Relative %: 59 %
Platelets: 239 10*3/uL (ref 150–400)
RBC: 4.96 MIL/uL (ref 4.22–5.81)
RDW: 12.9 % (ref 11.5–15.5)
WBC: 8.9 10*3/uL (ref 4.0–10.5)
nRBC: 0 % (ref 0.0–0.2)

## 2019-10-08 LAB — BASIC METABOLIC PANEL
Anion gap: 9 (ref 5–15)
BUN: 20 mg/dL (ref 8–23)
CO2: 26 mmol/L (ref 22–32)
Calcium: 9.5 mg/dL (ref 8.9–10.3)
Chloride: 104 mmol/L (ref 98–111)
Creatinine, Ser: 1.11 mg/dL (ref 0.61–1.24)
GFR calc Af Amer: >60 mL/min (ref >60)
GFR calc non Af Amer: 58 mL/min — ABNORMAL LOW (ref >60)
Glucose, Bld: 126 mg/dL — ABNORMAL HIGH (ref 70–99)
Potassium: 4.8 mmol/L (ref 3.5–5.1)
Sodium: 139 mmol/L (ref 135–145)

## 2019-10-08 NOTE — Progress Notes (Signed)
Instructed patient on the following items: Arrival time 0930 Nothing to eat or drink after midnight No meds AM of procedure Responsible person to drive you home and stay with you for 24 hrs Wash with special soap night before and morning of procedure  

## 2019-10-09 LAB — SARS CORONAVIRUS 2 (TAT 6-24 HRS): SARS Coronavirus 2: NEGATIVE

## 2019-10-11 ENCOUNTER — Ambulatory Visit (HOSPITAL_COMMUNITY): Payer: Medicare Other

## 2019-10-11 ENCOUNTER — Ambulatory Visit (HOSPITAL_COMMUNITY)
Admission: RE | Admit: 2019-10-11 | Discharge: 2019-10-11 | Disposition: A | Discharge status: Home / Self Care | Payer: Medicare Other | Attending: Cardiology | Admitting: Cardiology

## 2019-10-11 ENCOUNTER — Encounter (HOSPITAL_COMMUNITY)
Admission: RE | Disposition: A | Discharge status: Home / Self Care | Payer: Self-pay | Source: Home / Self Care | Attending: Cardiology

## 2019-10-11 ENCOUNTER — Other Ambulatory Visit: Payer: Self-pay

## 2019-10-11 DIAGNOSIS — Z7982 Long term (current) use of aspirin: Secondary | ICD-10-CM | POA: Diagnosis not present

## 2019-10-11 DIAGNOSIS — Z87891 Personal history of nicotine dependence: Secondary | ICD-10-CM | POA: Diagnosis not present

## 2019-10-11 DIAGNOSIS — M109 Gout, unspecified: Secondary | ICD-10-CM | POA: Insufficient documentation

## 2019-10-11 DIAGNOSIS — I442 Atrioventricular block, complete: Secondary | ICD-10-CM | POA: Diagnosis present

## 2019-10-11 DIAGNOSIS — J45909 Unspecified asthma, uncomplicated: Secondary | ICD-10-CM | POA: Insufficient documentation

## 2019-10-11 DIAGNOSIS — Z79899 Other long term (current) drug therapy: Secondary | ICD-10-CM | POA: Insufficient documentation

## 2019-10-11 DIAGNOSIS — I10 Essential (primary) hypertension: Secondary | ICD-10-CM | POA: Diagnosis not present

## 2019-10-11 DIAGNOSIS — Z950 Presence of cardiac pacemaker: Secondary | ICD-10-CM

## 2019-10-11 DIAGNOSIS — E119 Type 2 diabetes mellitus without complications: Secondary | ICD-10-CM | POA: Insufficient documentation

## 2019-10-11 DIAGNOSIS — Z7984 Long term (current) use of oral hypoglycemic drugs: Secondary | ICD-10-CM | POA: Insufficient documentation

## 2019-10-11 HISTORY — PX: PACEMAKER IMPLANT: EP1218

## 2019-10-11 LAB — GLUCOSE, CAPILLARY: Glucose-Capillary: 127 mg/dL — ABNORMAL HIGH (ref 70–99)

## 2019-10-11 SURGERY — PACEMAKER IMPLANT
Anesthesia: LOCAL

## 2019-10-11 MED ORDER — LIDOCAINE HCL (PF) 1 % IJ SOLN
INTRAMUSCULAR | Status: AC
  Filled 2019-10-11: qty 60

## 2019-10-11 MED ORDER — CEFAZOLIN SODIUM-DEXTROSE 2-4 GM/100ML-% IV SOLN
INTRAVENOUS | Status: AC
  Filled 2019-10-11: qty 100

## 2019-10-11 MED ORDER — HEPARIN (PORCINE) IN NACL 2-0.9 UNITS/ML
INTRAMUSCULAR | Status: DC | PRN
  Administered 2019-10-11: 500 mL

## 2019-10-11 MED ORDER — CEFAZOLIN SODIUM-DEXTROSE 2-4 GM/100ML-% IV SOLN
2.0000 g | INTRAVENOUS | Status: AC
  Administered 2019-10-11: 2 g via INTRAVENOUS

## 2019-10-11 MED ORDER — ONDANSETRON HCL 4 MG/2ML IJ SOLN: 4.0000 mg | Freq: Four times a day (QID) | INTRAMUSCULAR | Status: DC | PRN

## 2019-10-11 MED ORDER — ACETAMINOPHEN 325 MG PO TABS
325.0000 mg | ORAL_TABLET | ORAL | Status: DC | PRN
  Filled 2019-10-11: qty 2

## 2019-10-11 MED ORDER — HEPARIN (PORCINE) IN NACL 1000-0.9 UT/500ML-% IV SOLN
INTRAVENOUS | Status: AC
  Filled 2019-10-11: qty 500

## 2019-10-11 MED ORDER — FENTANYL CITRATE (PF) 100 MCG/2ML IJ SOLN
INTRAMUSCULAR | Status: DC | PRN
Start: 2019-10-11 — End: 2019-10-11
  Administered 2019-10-11 (×2): 25 ug via INTRAVENOUS

## 2019-10-11 MED ORDER — SODIUM CHLORIDE 0.9 % IV SOLN: INTRAVENOUS | Status: DC

## 2019-10-11 MED ORDER — FENTANYL CITRATE (PF) 100 MCG/2ML IJ SOLN
INTRAMUSCULAR | Status: AC
  Filled 2019-10-11: qty 2

## 2019-10-11 MED ORDER — SODIUM CHLORIDE 0.9 % IV SOLN
80.0000 mg | INTRAVENOUS | Status: DC
  Filled 2019-10-11: qty 2

## 2019-10-11 MED ORDER — CHLORHEXIDINE GLUCONATE 4 % EX LIQD
4.0000 "application " | Freq: Once | CUTANEOUS | Status: DC
  Filled 2019-10-11: qty 60

## 2019-10-11 MED ORDER — MIDAZOLAM HCL 5 MG/5ML IJ SOLN
INTRAMUSCULAR | Status: AC
  Filled 2019-10-11: qty 5

## 2019-10-11 MED ORDER — SODIUM CHLORIDE 0.9 % IV SOLN
INTRAVENOUS | Status: AC
  Filled 2019-10-11: qty 2

## 2019-10-11 MED ORDER — LIDOCAINE HCL (PF) 1 % IJ SOLN
INTRAMUSCULAR | Status: DC | PRN
  Administered 2019-10-11: 60 mL

## 2019-10-11 MED ORDER — MIDAZOLAM HCL 5 MG/5ML IJ SOLN
INTRAMUSCULAR | Status: DC | PRN
  Administered 2019-10-11 (×2): 1 mg via INTRAVENOUS

## 2019-10-11 SURGICAL SUPPLY — 8 items
CABLE SURGICAL S-101-97-12 (CABLE) ×2 IMPLANT
LEAD TENDRIL MRI 46CM LPA1200M (Lead) ×2 IMPLANT
LEAD TENDRIL MRI 52CM LPA1200M (Lead) ×2 IMPLANT
PACEMAKER ASSURITY DR-RF (Pacemaker) ×2 IMPLANT
PAD PRO RADIOLUCENT 2001M-C (PAD) ×2 IMPLANT
SHEATH 8FR PRELUDE SNAP 13 (SHEATH) ×4 IMPLANT
SHEATH PROBE COVER 6X72 (BAG) ×2 IMPLANT
TRAY PACEMAKER INSERTION (PACKS) ×2 IMPLANT

## 2019-10-11 NOTE — Progress Notes (Signed)
Patient and daughter was given discharge instructions. Both verbalized understanding. 

## 2019-10-11 NOTE — Discharge Instructions (Signed)
After Your Pacemaker   . You have a [employer] Pacemaker  ACTIVITY . Do not lift your arm above shoulder height for 1 week after your procedure. After 7 days, you may progress as below.     Monday October 18, 2019  Tuesday October 19, 2019 Wednesday October 20, 2019 Thursday October 21, 2019   . Do not lift, push, pull, or carry anything over 10 pounds with the affected arm until 6 weeks (Monday November 22, 2019 ) after your procedure.   . Do NOT DRIVE until you have been seen for your wound check, or as long as instructed by your healthcare provider.   . Ask your healthcare provider when you can go back to work   INCISION/Dressing . If you are on a blood thinner such as Coumadin, Xarelto, Eliquis, Plavix, or Pradaxa please confirm with your provider when this should be resumed.   . Monitor your Pacemaker site for redness, swelling, and drainage. Call the device clinic at [phone] if you experience these symptoms or fever/chills.  . If your incision is sealed with Steri-strips or staples, you may shower 10 days after your procedure or when told by your provider. Do not remove the steri-strips or let the shower hit directly on your site. You may wash around your site with soap and water.    . Avoid lotions, ointments, or perfumes over your incision until it is well-healed.  . You may use a hot tub or a pool AFTER your wound check appointment if the incision is completely closed.  . PAcemaker Alerts:  Some alerts are vibratory and others beep. These are NOT emergencies. Please call our office to let us know. If this occurs at night or on weekends, it can wait until the next business day. Send a remote transmission.  . If your device is capable of reading fluid status (for heart failure), you will be offered monthly monitoring to review this with you.   DEVICE MANAGEMENT . Remote monitoring is used to monitor your pacemaker from home. This monitoring is scheduled every 91  days by our office. It allows us to keep an eye on the functioning of your device to ensure it is working properly. You will routinely see your Electrophysiologist annually (more often if necessary).   . You should receive your ID card for your new device in 4-8 weeks. Keep this card with you at all times once received. Consider wearing a medical alert bracelet or necklace.  . Your Pacemaker may be MRI compatible. This will be discussed at your next office visit/wound check.  You should avoid contact with strong electric or magnetic fields.    Do not use amateur (ham) radio equipment or electric (arc) welding torches. MP3 player headphones with magnets should not be used. Some devices are safe to use if held at least 12 inches (30 cm) from your Pacemaker. These include power tools, lawn mowers, and speakers. If you are unsure if something is safe to use, ask your health care provider.   When using your cell phone, hold it to the ear that is on the opposite side from the Pacemaker. Do not leave your cell phone in a pocket over the Pacemaker.   You may safely use electric blankets, heating pads, computers, and microwave ovens.  Call the office right away if:  You have chest pain.  You feel more short of breath than you have felt before.  You feel more light-headed than you have felt before.    Your incision starts to open up.  This information is not intended to replace advice given to you by your health care provider. Make sure you discuss any questions you have with your health care provider.   

## 2019-10-11 NOTE — H&P (Signed)
Cardiology Office Note:     ID:  Ronnie Lawrence 1927/11/26, MRN [id number]  PCP:  [name], MD        [place] Cardiologist:  No primary care provider on file.  [facility] Electrophysiologist:  None   Referring MD: [name], MD   Abnormal ECG  History of Present Illness:    Ronnie Lawrence is a 86 y.o. male with a hx of cataracts, DM, HTN who presents for evaluation after an abnormal ECG. Patient denies any history of palpitations, syncope, presyncope.       Past Medical History:  Diagnosis Date  . Asthma   . Diabetes mellitus   . Gout   . Hypertension   . Wears dentures    partial upper         Past Surgical History:  Procedure Laterality Date  . BACK SURGERY    . CATARACT EXTRACTION W/PHACO Left 08/31/2019   Procedure: CATARACT EXTRACTION PHACO AND INTRAOCULAR LENS PLACEMENT (IOC) LEFT DIABETIC 9.58 00:51.0;  Surgeon: Birder Robson, MD;  Location: [place];  Service: Ophthalmology;  Laterality: Left;  Diabetic - oral meds  . COLONOSCOPY N/A 08/13/2013   Procedure: COLONOSCOPY;  Surgeon: Rogene Houston, MD;  [facility];  Service: Endoscopy;  Laterality: N/A;  1050  . HERNIA REPAIR     bilateral    Current Medications: Active Medications      Current Meds  Medication Sig  . aspirin EC 81 MG tablet Take 81 mg by mouth daily.    Marland Kitchen atorvastatin (LIPITOR) 10 MG tablet Take 10 mg by mouth at bedtime.    . magnesium gluconate (MAGONATE) 500 MG tablet Take 500 mg by mouth daily.  . metFORMIN (GLUCOPHAGE-XR) 500 MG 24 hr tablet Take 500 mg by mouth 2 (two) times daily.   . Multiple Vitamin (MULTIVITAMIN) tablet Take 1 tablet by mouth daily.       Allergies:   Patient has no known allergies.   Social History        Socioeconomic History  . Marital status: Widowed    Spouse name: Not on file  . Number of children: Not on file  . Years of education: Not on file  . Highest education  level: Not on file  Occupational History  . Not on file  Tobacco Use  . Smoking status: Former Smoker    Quit date: 1981    Years since quitting: 40.6  . Smokeless tobacco: Never Used  Vaping Use  . Vaping Use: Never used  Substance and Sexual Activity  . Alcohol use: Yes    Alcohol/week: 7.0 standard drinks    Types: 7 Standard drinks or equivalent per week  . Drug use: No  . Sexual activity: Not on file  Other Topics Concern  . Not on file  Social History Narrative  . Not on file   Social Determinants of Health      Financial Resource Strain:   . Difficulty of Paying Living Expenses:   Food Insecurity:   . Worried About Charity fundraiser in the Last Year:   . Arboriculturist in the Last Year:   Transportation Needs:   . Film/video editor (Medical):   Marland Kitchen Lack of Transportation (Non-Medical):   Physical Activity:   . Days of Exercise per Week:   . Minutes of Exercise per Session:   Stress:   . Feeling of Stress :   Social Connections:   .  Frequency of Communication with Friends and Family:   . Frequency of Social Gatherings with Friends and Family:   . Attends Religious Services:   . Active Member of Clubs or Organizations:   . Attends Archivist Meetings:   Marland Kitchen Marital Status:      Family History: The patient's family history is not on file.  ROS:   Please see the history of present illness.    All other systems reviewed and are negative.  EKGs/Labs/Other Studies Reviewed:    The following studies were reviewed today: ECG  EKG:  EKG is  ordered today.  The ekg ordered today demonstrates wenckebach av conduction with RBBB QRS.  Recent Labs: No results found for requested labs within last 8760 hours.  Recent Lipid Panel Labs (Brief)  No results found for: CHOL, TRIG, HDL, CHOLHDL, VLDL, LDLCALC, LDLDIRECT    Physical Exam:    VS:  BP (!) 142/82   Pulse 79   Ht 5\' 9"  (1.753 m)   Wt 187 lb 12.8 oz (85.2 kg)   SpO2  95%   BMI 27.73 kg/m        Wt Readings from Last 3 Encounters:  09/08/19 187 lb 12.8 oz (85.2 kg)  08/31/19 188 lb (85.3 kg)  08/13/13 190 lb (86.2 kg)     GEN: no acute distress HEENT: Normal NECK: No JVD; No carotid bruits LYMPHATICS: No lymphadenopathy CARDIAC: RRR, no murmurs, rubs, gallops RESPIRATORY:  Clear to auscultation without rales, wheezing or rhonchi  ABDOMEN: Soft, non-tender, non-distended MUSCULOSKELETAL:  No edema; No deformity  SKIN: Warm and dry NEUROLOGIC:  Alert and oriented x 3 PSYCHIATRIC:  Normal affect   ASSESSMENT & PLAN:    I have seen and examined the patient today to update the above H&P.    #1. Complete heart block He has episodes of symptomatic complete heart block. Plan to proceed with permanent pacemaker imlant.   Lysbeth Galas T. Quentin Ore, MD, [facility] Cardiac Electrophysiology

## 2019-10-12 ENCOUNTER — Encounter (HOSPITAL_COMMUNITY): Payer: Self-pay | Admitting: Cardiology

## 2019-10-12 MED FILL — Gentamicin Sulfate Inj 40 MG/ML: INTRAMUSCULAR | Qty: 80 | Status: AC

## 2019-10-21 ENCOUNTER — Ambulatory Visit (INDEPENDENT_AMBULATORY_CARE_PROVIDER_SITE_OTHER): Payer: Medicare Other | Admitting: Emergency Medicine

## 2019-10-21 ENCOUNTER — Other Ambulatory Visit: Payer: Self-pay

## 2019-10-21 DIAGNOSIS — I441 Atrioventricular block, second degree: Secondary | ICD-10-CM | POA: Diagnosis not present

## 2019-10-21 LAB — CUP PACEART INCLINIC DEVICE CHECK
Battery Remaining Longevity: 80 mo
Battery Voltage: 3.05 V
Brady Statistic RA Percent Paced: 7.1 %
Brady Statistic RV Percent Paced: 99.32 %
Date Time Interrogation Session: 20210930161213
Implantable Lead Implant Date: 20210920
Implantable Lead Implant Date: 20210920
Implantable Lead Location: 753859
Implantable Lead Location: 753860
Implantable Pulse Generator Implant Date: 20210920
Lead Channel Impedance Value: 562.5 Ohm
Lead Channel Impedance Value: 687.5 Ohm
Lead Channel Pacing Threshold Amplitude: 0.5 V
Lead Channel Pacing Threshold Amplitude: 0.5 V
Lead Channel Pacing Threshold Amplitude: 0.75 V
Lead Channel Pacing Threshold Amplitude: 0.75 V
Lead Channel Pacing Threshold Pulse Width: 0.5 ms
Lead Channel Pacing Threshold Pulse Width: 0.5 ms
Lead Channel Pacing Threshold Pulse Width: 0.5 ms
Lead Channel Pacing Threshold Pulse Width: 0.5 ms
Lead Channel Sensing Intrinsic Amplitude: 2.6 mV
Lead Channel Sensing Intrinsic Amplitude: 8.8 mV
Lead Channel Setting Pacing Amplitude: 3.5 V
Lead Channel Setting Pacing Amplitude: 3.5 V
Lead Channel Setting Pacing Pulse Width: 0.5 ms
Lead Channel Setting Sensing Sensitivity: 2 mV
Pulse Gen Model: 2272
Pulse Gen Serial Number: 3860652

## 2019-10-21 NOTE — Patient Instructions (Signed)
When you get home push white button on remote monitor to send a manual transmission.  If you have any trouble with this call our office at [phone] between the hours of 8am- 5pm Monday thru Friday.

## 2019-10-21 NOTE — Progress Notes (Signed)
Wound check appointment. Steri-strips removed. Wound without redness or edema. Incision edges approximated, wound well healed. Normal device function. Thresholds, sensing, and impedances consistent with implant measurements. Device programmed at 3.5V for extra safety margin until 3 month visit. Histogram distribution appropriate for patient and level of activity. No mode switches or high ventricular rates noted. Patient educated about wound care, arm mobility, lifting restrictions. Patient is enrolled in remote monitoring, educated patient to send manual transmission when he gets home.  Next scheduled check 01/10/20.  ROV with Dr. Quentin Ore on 01/12/20.

## 2019-12-15 ENCOUNTER — Ambulatory Visit: Payer: Medicare Other | Admitting: Cardiology

## 2020-01-10 LAB — CUP PACEART REMOTE DEVICE CHECK
Battery Remaining Longevity: 77 mo
Battery Remaining Percentage: 95.5 %
Battery Voltage: 3.04 V
Brady Statistic AP VP Percent: 5.5 %
Brady Statistic AP VS Percent: 1 %
Brady Statistic AS VP Percent: 94 %
Brady Statistic AS VS Percent: 1 %
Brady Statistic RA Percent Paced: 5.1 %
Brady Statistic RV Percent Paced: 99 %
Date Time Interrogation Session: 20211220020259
Implantable Lead Implant Date: 20210920
Implantable Lead Implant Date: 20210920
Implantable Lead Location: 753859
Implantable Lead Location: 753860
Implantable Pulse Generator Implant Date: 20210920
Lead Channel Impedance Value: 490 Ohm
Lead Channel Impedance Value: 830 Ohm
Lead Channel Pacing Threshold Amplitude: 0.5 V
Lead Channel Pacing Threshold Amplitude: 0.75 V
Lead Channel Pacing Threshold Pulse Width: 0.5 ms
Lead Channel Pacing Threshold Pulse Width: 0.5 ms
Lead Channel Sensing Intrinsic Amplitude: 12 mV
Lead Channel Sensing Intrinsic Amplitude: 4.2 mV
Lead Channel Setting Pacing Amplitude: 3.5 V
Lead Channel Setting Pacing Amplitude: 3.5 V
Lead Channel Setting Pacing Pulse Width: 0.5 ms
Lead Channel Setting Sensing Sensitivity: 2 mV
Pulse Gen Model: 2272
Pulse Gen Serial Number: 3860652

## 2020-01-12 ENCOUNTER — Encounter: Payer: Self-pay | Admitting: Cardiology

## 2020-01-12 ENCOUNTER — Other Ambulatory Visit: Payer: Self-pay

## 2020-01-12 ENCOUNTER — Ambulatory Visit (INDEPENDENT_AMBULATORY_CARE_PROVIDER_SITE_OTHER): Payer: Medicare Other | Admitting: Cardiology

## 2020-01-12 VITALS — BP 130/68 | HR 68 | Ht 70.0 in | Wt 189.0 lb

## 2020-01-12 DIAGNOSIS — Z950 Presence of cardiac pacemaker: Secondary | ICD-10-CM

## 2020-01-12 DIAGNOSIS — I442 Atrioventricular block, complete: Secondary | ICD-10-CM

## 2020-01-12 LAB — PACEMAKER DEVICE OBSERVATION

## 2020-01-12 NOTE — Patient Instructions (Signed)
Medication Instructions:  - Your physician recommends that you continue on your current medications as directed. Please refer to the Current Medication list given to you today.  *If you need a refill on your cardiac medications before your next appointment, please call your pharmacy*   Lab Work: - none ordered  If you have labs (blood work) drawn today and your tests are completely normal, you will receive your results only by: Marland Kitchen MyChart Message (if you have MyChart) OR . A paper copy in the mail If you have any lab test that is abnormal or we need to change your treatment, we will call you to review the results.   Testing/Procedures: - none ordered   Follow-Up: At [facility], you and your health needs are our priority.  As part of our continuing mission to provide you with exceptional heart care, we have created designated Provider Care Teams.  These Care Teams include your primary Cardiologist (physician) and Advanced Practice Providers (APPs -  Physician Assistants and Nurse Practitioners) who all work together to provide you with the care you need, when you need it.  We recommend signing up for the patient portal called "MyChart".  Sign up information is provided on this After Visit Summary.  MyChart is used to connect with patients for Virtual Visits (Telemedicine).  Patients are able to view lab/test results, encounter notes, upcoming appointments, etc.  Non-urgent messages can be sent to your provider as well.   To learn more about what you can do with MyChart, go to [email or web address].    Your next appointment:   9 month(s)  The format for your next appointment:   In Person  Provider:   Lars Mage, MD   Other Instructions n/a

## 2020-01-12 NOTE — Progress Notes (Signed)
Electrophysiology Office Follow up Visit Note:    Date:  01/12/2020   ID:  Ronnie Lawrence, DOB 02-12-1927, MRN [id number]  PCP:  [name], MD  [place] Cardiologist:  No primary care provider on file.  [facility] Electrophysiologist:  Vickie Epley, MD    Interval History:    Ronnie Lawrence is a 86 y.o. male who presents for a follow up visit.  He had a permanent pacemaker implanted October 11, 2019.  Device check since that surgery have shown stable lead parameters.  No problems with pocket healing.     Past Medical History:  Diagnosis Date  . Asthma   . Diabetes mellitus   . Gout   . Hypertension   . Wears dentures    partial upper    Past Surgical History:  Procedure Laterality Date  . BACK SURGERY    . CATARACT EXTRACTION W/PHACO Left 08/31/2019   Procedure: CATARACT EXTRACTION PHACO AND INTRAOCULAR LENS PLACEMENT (IOC) LEFT DIABETIC 9.58 00:51.0;  Surgeon: Birder Robson, MD;  Location: [place];  Service: Ophthalmology;  Laterality: Left;  Diabetic - oral meds  . CATARACT EXTRACTION W/PHACO Right 09/21/2019   Procedure: CATARACT EXTRACTION PHACO AND INTRAOCULAR LENS PLACEMENT (IOC) RIGHT DIABETIC 13.56  01:10.7;  Surgeon: Birder Robson, MD;  Location: [place];  Service: Ophthalmology;  Laterality: Right;  . COLONOSCOPY N/A 08/13/2013   Procedure: COLONOSCOPY;  Surgeon: Rogene Houston, MD;  [facility];  Service: Endoscopy;  Laterality: N/A;  1050  . HERNIA REPAIR     bilateral  . PACEMAKER IMPLANT N/A 10/11/2019   Procedure: PACEMAKER IMPLANT;  Surgeon: Vickie Epley, MD;  Location: [place] [facility];  Service: Cardiovascular;  Laterality: N/A;    Current Medications: Current Meds  Medication Sig  . aspirin EC 81 MG tablet Take 81 mg by mouth daily.  Marland Kitchen atorvastatin (LIPITOR) 10 MG tablet Take 10 mg by mouth at bedtime.  . magnesium gluconate (MAGONATE) 500 MG tablet Take 500 mg by mouth daily.  .  metFORMIN (GLUCOPHAGE-XR) 500 MG 24 hr tablet Take 500 mg by mouth 2 (two) times daily.     Allergies:   Patient has no known allergies.   Social History   Socioeconomic History  . Marital status: Widowed    Spouse name: Not on file  . Number of children: Not on file  . Years of education: Not on file  . Highest education level: Not on file  Occupational History  . Not on file  Tobacco Use  . Smoking status: Former Smoker    Quit date: 1981    Years since quitting: 41.0  . Smokeless tobacco: Never Used  Vaping Use  . Vaping Use: Never used  Substance and Sexual Activity  . Alcohol use: Yes    Alcohol/week: 7.0 standard drinks    Types: 7 Standard drinks or equivalent per week  . Drug use: No  . Sexual activity: Not on file  Other Topics Concern  . Not on file  Social History Narrative  . Not on file   Social Determinants of Health   Financial Resource Strain: Not on file  Food Insecurity: Not on file  Transportation Needs: Not on file  Physical Activity: Not on file  Stress: Not on file  Social Connections: Not on file     Family History: The patient's family history is not on file.  ROS:   Please see the history of present illness.  All other systems reviewed and are negative.  EKGs/Labs/Other Studies Reviewed:    The following studies were reviewed today: Prior notes, pacemaker implant note  EKG:  The ekg ordered today demonstrates a sensed, V paced rhythm  January 12, 2020 device interrogation personally reviewed Battery longevity 11 years Atrial lead impedance 540 ohms, sensing greater than 5 mV, capture at 0.625 V at 0.5 ms Ventricular lead impedance 830 ohms, sensing greater than 12 mV, capture threshold 0.25 V at 0.5 ms We reprogrammed the AV delays to allow his intrinsic rhythm today DDD 60-1 10 Turned VIP on    Recent Labs: 10/08/2019: BUN 20; Creatinine, Ser 1.11; Hemoglobin 16.4; Platelets 239; Potassium 4.8; Sodium 139  Recent Lipid  Panel No results found for: CHOL, TRIG, HDL, CHOLHDL, VLDL, LDLCALC, LDLDIRECT  Physical Exam:    VS:  BP 130/68   Pulse 68   Ht 5\' 10"  (1.778 m)   Wt 189 lb (85.7 kg)   SpO2 96%   BMI 27.12 kg/m     Wt Readings from Last 3 Encounters:  01/12/20 189 lb (85.7 kg)  10/11/19 188 lb (85.3 kg)  09/21/19 186 lb 6.4 oz (84.6 kg)     GEN:  Well nourished, well developed in no acute distress HEENT: Normal NECK: No JVD; No carotid bruits LYMPHATICS: No lymphadenopathy CARDIAC: RRR, no murmurs, rubs, gallops.  Pacemaker pocket well-healed. RESPIRATORY:  Clear to auscultation without rales, wheezing or rhonchi  ABDOMEN: Soft, non-tender, non-distended MUSCULOSKELETAL:  No edema; No deformity  SKIN: Warm and dry NEUROLOGIC:  Alert and oriented x 3 PSYCHIATRIC:  Normal affect   ASSESSMENT:    1. Heart block AV complete ([place])   2. Cardiac pacemaker in situ    PLAN:    In order of problems listed above:  1. Complete heart block now post permanent pacemaker implant Doing well after permanent pacemaker implant.  Device is well-healed.  Pacemaker interrogation shows stable lead parameters and good battery longevity on today's check.  We will plan on seeing him back in clinic in 9 months.  He will establish for remote checks   Medication Adjustments/Labs and Tests Ordered: Current medicines are reviewed at length with the patient today.  Concerns regarding medicines are outlined above.  No orders of the defined types were placed in this encounter.  No orders of the defined types were placed in this encounter.    Signed, Lars Mage, MD, [facility]  01/12/2020 10:01 AM    Electrophysiology  [facility]

## 2020-01-22 DIAGNOSIS — I639 Cerebral infarction, unspecified: Secondary | ICD-10-CM

## 2020-01-22 HISTORY — DX: Cerebral infarction, unspecified: I63.9

## 2020-02-07 ENCOUNTER — Telehealth: Payer: Self-pay

## 2020-02-07 NOTE — Telephone Encounter (Signed)
Merlin remote received. Currently 8.9%; longest 14 hours 48 minutes; EGMs suggest A. Flutter - ongoing; V rates controlled. No history noted in EMR.   Remote transmission received this morning to see if still in AF. Presenting AF/ VP 70.   Patient is asymptomatic. No OAC on file. Advised patient I will forward to AF clinic and someone will call to see about [facility] for new onset of AF. Patient agreeable to plan.

## 2020-02-16 ENCOUNTER — Other Ambulatory Visit: Payer: Self-pay

## 2020-02-16 ENCOUNTER — Encounter: Payer: Self-pay | Admitting: Cardiology

## 2020-02-16 ENCOUNTER — Ambulatory Visit (INDEPENDENT_AMBULATORY_CARE_PROVIDER_SITE_OTHER): Payer: Medicare Other | Admitting: Cardiology

## 2020-02-16 VITALS — BP 136/70 | HR 70 | Ht 70.0 in | Wt 190.0 lb

## 2020-02-16 DIAGNOSIS — Z950 Presence of cardiac pacemaker: Secondary | ICD-10-CM | POA: Diagnosis not present

## 2020-02-16 DIAGNOSIS — I4892 Unspecified atrial flutter: Secondary | ICD-10-CM | POA: Diagnosis not present

## 2020-02-16 DIAGNOSIS — I442 Atrioventricular block, complete: Secondary | ICD-10-CM

## 2020-02-16 LAB — PACEMAKER DEVICE OBSERVATION

## 2020-02-16 MED ORDER — RIVAROXABAN 20 MG PO TABS: 20.0000 mg | ORAL_TABLET | Freq: Every day | ORAL | 11 refills | Status: DC

## 2020-02-16 NOTE — Progress Notes (Signed)
Electrophysiology Office Follow up Visit Note:    Date:  02/16/2020   ID:  Ronnie Lawrence, DOB 02-Apr-1927, MRN [id number]  PCP:  [name], MD  [place] Cardiologist:  No primary care provider on file.  [facility] Electrophysiologist:  Vickie Epley, MD    Interval History:    Ronnie Lawrence is a 87 y.o. male who presents for a follow up visit.  I last saw the patient January 12, 2020 after permanent pacemaker was implanted on October 11, 2019.  He has been doing well after the pacemaker implant.  Lead parameters and battery longevity are stable and checks.  He was incidentally noted to be in atrial flutter on device interrogation.  This is been Ongoing since February 06, 2020.  He is here today to discuss stroke prophylaxis.  He is currently taking aspirin 81 mg daily. Ronnie Lawrence is doing well at home.  He lives independently and takes care of all of his ADLs.  He cooks, cleans, walks for exercise.  He is with his daughter in clinic today.   Past Medical History:  Diagnosis Date  . Asthma   . Diabetes mellitus   . Gout   . Hypertension   . Wears dentures    partial upper    Past Surgical History:  Procedure Laterality Date  . BACK SURGERY    . CATARACT EXTRACTION W/PHACO Left 08/31/2019   Procedure: CATARACT EXTRACTION PHACO AND INTRAOCULAR LENS PLACEMENT (IOC) LEFT DIABETIC 9.58 00:51.0;  Surgeon: Birder Robson, MD;  Location: [place];  Service: Ophthalmology;  Laterality: Left;  Diabetic - oral meds  . CATARACT EXTRACTION W/PHACO Right 09/21/2019   Procedure: CATARACT EXTRACTION PHACO AND INTRAOCULAR LENS PLACEMENT (IOC) RIGHT DIABETIC 13.56  01:10.7;  Surgeon: Birder Robson, MD;  Location: [place];  Service: Ophthalmology;  Laterality: Right;  . COLONOSCOPY N/A 08/13/2013   Procedure: COLONOSCOPY;  Surgeon: Rogene Houston, MD;  [facility];  Service: Endoscopy;  Laterality: N/A;  1050  . HERNIA REPAIR      bilateral  . PACEMAKER IMPLANT N/A 10/11/2019   Procedure: PACEMAKER IMPLANT;  Surgeon: Vickie Epley, MD;  Location: [place] [facility];  Service: Cardiovascular;  Laterality: N/A;    Current Medications: Current Meds  Medication Sig  . aspirin EC 81 MG tablet Take 81 mg by mouth daily.  Marland Kitchen atorvastatin (LIPITOR) 10 MG tablet Take 10 mg by mouth at bedtime.  . magnesium gluconate (MAGONATE) 500 MG tablet Take 500 mg by mouth daily.  . metFORMIN (GLUCOPHAGE-XR) 500 MG 24 hr tablet Take 500 mg by mouth 2 (two) times daily.     Allergies:   Patient has no known allergies.   Social History   Socioeconomic History  . Marital status: Widowed    Spouse name: Not on file  . Number of children: Not on file  . Years of education: Not on file  . Highest education level: Not on file  Occupational History  . Not on file  Tobacco Use  . Smoking status: Former Smoker    Quit date: 1981    Years since quitting: 41.0  . Smokeless tobacco: Never Used  Vaping Use  . Vaping Use: Never used  Substance and Sexual Activity  . Alcohol use: Yes    Alcohol/week: 7.0 standard drinks    Types: 7 Standard drinks or equivalent per week  . Drug use: No  . Sexual activity: Not on file  Other Topics Concern  .  Not on file  Social History Narrative  . Not on file   Social Determinants of Health   Financial Resource Strain: Not on file  Food Insecurity: Not on file  Transportation Needs: Not on file  Physical Activity: Not on file  Stress: Not on file  Social Connections: Not on file     Family History: The patient's family history is not on file.  ROS:   Please see the history of present illness.    All other systems reviewed and are negative.  EKGs/Labs/Other Studies Reviewed:    The following studies were reviewed today:  February 16, 2020 device interrogation personally reviewed Presenting rhythm atrial flutter/ventricular pacing at 70 Underlying rhythm atrial flutter with slow  ventricular rates in the 30s Programmed DDD 60-1 10 Lead parameters stable Battery longevity around 10.6 years 9000 episodes of PMT do to a rate responsive P. Vartan turned on. Reprogrammed to turn off the P mark during today's visit.  A pacing 3.7%, V pacing 93%  EKG today shows atrial flutter with ventricular pacing at 70 bpm  Recent Labs: 10/08/2019: BUN 20; Creatinine, Ser 1.11; Hemoglobin 16.4; Platelets 239; Potassium 4.8; Sodium 139  Recent Lipid Panel No results found for: CHOL, TRIG, HDL, CHOLHDL, VLDL, LDLCALC, LDLDIRECT  Physical Exam:    VS:  BP 136/70 (BP Location: Left Arm, Patient Position: Sitting, Cuff Size: Normal)   Pulse 70   Ht 5\' 10"  (1.778 m)   Wt 190 lb (86.2 kg)   SpO2 96%   BMI 27.26 kg/m     Wt Readings from Last 3 Encounters:  02/16/20 190 lb (86.2 kg)  01/12/20 189 lb (85.7 kg)  10/11/19 188 lb (85.3 kg)     GEN: Well nourished, well developed in no acute distress HEENT: Normal NECK: No JVD; No carotid bruits LYMPHATICS: No lymphadenopathy CARDIAC: RRR, no murmurs, rubs, gallops.  Pacemaker pocket well-healed. RESPIRATORY:  Clear to auscultation without rales, wheezing or rhonchi  ABDOMEN: Soft, non-tender, non-distended MUSCULOSKELETAL:  No edema; No deformity  SKIN: Warm and dry NEUROLOGIC:  Alert and oriented x 3 PSYCHIATRIC:  Normal affect   ASSESSMENT:    No diagnosis found. PLAN:    In order of problems listed above:  1. Complete heart block post permanent pacemaker Pacemaker well-functioning.  Incision well-healed.  2.  Atrial flutter with complete heart block New diagnosis Given a CHA2DS2-VASc of 4, will initiate systemic anticoagulation with Xarelto.  We will stop his aspirin 81 mg daily.  I will plan on seeing her back in 6 months.  I discussed anticoagulation with the patient his daughter who is with him today including the associated risks of being on an anticoagulant.  I will plan on seeing him back in 3 months.  If he  is still in atrial fibrillation/flutter at that appointment, can discuss restoring normal rhythm.    Medication Adjustments/Labs and Tests Ordered: Current medicines are reviewed at length with the patient today.  Concerns regarding medicines are outlined above.  No orders of the defined types were placed in this encounter.  No orders of the defined types were placed in this encounter.    Signed, Lars Mage, MD, [facility]  02/16/2020 11:36 AM    Electrophysiology [facility]

## 2020-02-16 NOTE — Patient Instructions (Signed)
Medication Instructions:  - Your physician has recommended you make the following change in your medication:   1) STOP Aspirin  2) START Xarelto 20 mg- take 1 tablet by mouth once daily   *If you need a refill on your cardiac medications before your next appointment, please call your pharmacy*   Lab Work: - none ordered  If you have labs (blood work) drawn today and your tests are completely normal, you will receive your results only by: Marland Kitchen MyChart Message (if you have MyChart) OR . A paper copy in the mail If you have any lab test that is abnormal or we need to change your treatment, we will call you to review the results.   Testing/Procedures: - none ordered   Follow-Up: At [facility], you and your health needs are our priority.  As part of our continuing mission to provide you with exceptional heart care, we have created designated Provider Care Teams.  These Care Teams include your primary Cardiologist (physician) and Advanced Practice Providers (APPs -  Physician Assistants and Nurse Practitioners) who all work together to provide you with the care you need, when you need it.  We recommend signing up for the patient portal called "MyChart".  Sign up information is provided on this After Visit Summary.  MyChart is used to connect with patients for Virtual Visits (Telemedicine).  Patients are able to view lab/test results, encounter notes, upcoming appointments, etc.  Non-urgent messages can be sent to your provider as well.   To learn more about what you can do with MyChart, go to [email or web address].    Your next appointment:   3 month(s)  The format for your next appointment:   In Person  Provider:   Lars Mage, MD   Other Instructions  Xarelto (Rivaroxaban) oral tablets What is this medicine? RIVAROXABAN (ri va ROX a ban) is an anticoagulant (blood thinner). It is used to treat blood clots in the lungs or in the veins. It is also used to prevent blood  clots in the lungs or in the veins. It is also used to lower the chance of stroke in people with a medical condition called atrial fibrillation. This medicine may be used for other purposes; ask your health care provider or pharmacist if you have questions. COMMON BRAND NAME(S): Xarelto, Xarelto Starter Pack What should I tell my health care provider before I take this medicine? They need to know if you have any of these conditions:  antiphospholipid antibody syndrome  artificial heart valve  bleeding disorders  bleeding in the brain  blood in your stools (black or tarry stools) or if you have blood in your vomit  history of blood clots  history of stomach bleeding  kidney disease  liver disease  low blood counts, like low white cell, platelet, or red cell counts  recent or planned spinal or epidural procedure  take medicines that treat or prevent blood clots  an unusual or allergic reaction to rivaroxaban, other medicines, foods, dyes, or preservatives  pregnant or trying to get pregnant  breast-feeding How should I use this medicine? Take this medicine by mouth with a glass of water. Follow the directions on the prescription label. Take your medicine at regular intervals. Do not take it more often than directed. Do not stop taking except on your doctor's advice. Stopping this medicine may increase your risk of a blood clot. Be sure to refill your prescription before you run out of medicine. If you are taking this medicine  after hip or knee replacement surgery, take it with or without food. If you are taking this medicine for atrial fibrillation, take it with your evening meal. If you are taking this medicine to treat blood clots, take it with food at the same time each day. If you are unable to swallow your tablet, you may crush the tablet and mix it in applesauce. Then, immediately eat the applesauce. You should eat more food right after you eat the applesauce containing the  crushed tablet. Talk to your pediatrician regarding the use of this medicine in children. Special care may be needed. Overdosage: If you think you have taken too much of this medicine contact a poison control center or emergency room at once. NOTE: This medicine is only for you. Do not share this medicine with others. What if I miss a dose? If you take your medicine once a day and miss a dose, take the missed dose as soon as you remember. If it is almost time for your next dose, take only that dose. Do not take double or extra doses. If you take your medicine twice a day and miss a dose, take the missed dose immediately. In this instance, 2 tablets may be taken at the same time. The next day you should take 1 tablet twice a day as directed. What may interact with this medicine? Do not take this medicine with any of the following medications:  defibrotide This medicine may also interact with the following medications:  aspirin and aspirin-like medicines  certain antibiotics like erythromycin, azithromycin, and clarithromycin  certain medicines for fungal infections like ketoconazole and itraconazole  certain medicines for irregular heart beat like amiodarone, quinidine, dronedarone  certain medicines for seizures like carbamazepine, phenytoin  certain medicines that treat or prevent blood clots like warfarin, enoxaparin, and dalteparin  conivaptan  felodipine  indinavir  lopinavir; ritonavir  NSAIDS, medicines for pain and inflammation, like ibuprofen or naproxen  ranolazine  rifampin  ritonavir  SNRIs, medicines for depression, like desvenlafaxine, duloxetine, levomilnacipran, venlafaxine  SSRIs, medicines for depression, like citalopram, escitalopram, fluoxetine, fluvoxamine, paroxetine, sertraline  St. John's wort  verapamil This list may not describe all possible interactions. Give your health care provider a list of all the medicines, herbs, non-prescription drugs,  or dietary supplements you use. Also tell them if you smoke, drink alcohol, or use illegal drugs. Some items may interact with your medicine. What should I watch for while using this medicine? Visit your healthcare professional for regular checks on your progress. You may need blood work done while you are taking this medicine. Your condition will be monitored carefully while you are receiving this medicine. It is important not to miss any appointments. Avoid sports and activities that might cause injury while you are using this medicine. Severe falls or injuries can cause unseen bleeding. Be careful when using sharp tools or knives. Consider using an Copy. Take special care brushing or flossing your teeth. Report any injuries, bruising, or red spots on the skin to your healthcare professional. If you are going to need surgery or other procedure, tell your healthcare professional that you are taking this medicine. Wear a medical ID bracelet or chain. Carry a card that describes your disease and details of your medicine and dosage times. What side effects may I notice from receiving this medicine? Side effects that you should report to your doctor or health care professional as soon as possible:  allergic reactions like skin rash, itching or hives, swelling  of the face, lips, or tongue  back pain  redness, blistering, peeling or loosening of the skin, including inside the mouth  signs and symptoms of bleeding such as bloody or black, tarry stools; red or dark-brown urine; spitting up blood or brown material that looks like coffee grounds; red spots on the skin; unusual bruising or bleeding from the eye, gums, or nose  signs and symptoms of a blood clot such as chest pain; shortness of breath; pain, swelling, or warmth in the leg  signs and symptoms of a stroke such as changes in vision; confusion; trouble speaking or understanding; severe headaches; sudden numbness or weakness of the  face, arm or leg; trouble walking; dizziness; loss of coordination Side effects that usually do not require medical attention (report to your doctor or health care professional if they continue or are bothersome):  dizziness  muscle pain This list may not describe all possible side effects. Call your doctor for medical advice about side effects. You may report side effects to FDA at 1-800-FDA-1088. Where should I keep my medicine? Keep out of the reach of children. Store at room temperature between 15 and 30 degrees C (59 and 86 degrees F). Throw away any unused medicine after the expiration date. NOTE: This sheet is a summary. It may not cover all possible information. If you have questions about this medicine, talk to your doctor, pharmacist, or health care provider.  2021 Elsevier/Gold Standard (2018-04-06 09:45:59)

## 2020-03-03 ENCOUNTER — Telehealth: Payer: Self-pay | Admitting: Cardiology

## 2020-03-03 NOTE — Telephone Encounter (Signed)
Spoke with the pts daughter Ronnie Lawrence (not on DPR but pt gave verbal consent to speak with her) about call placed.  Per Melissa, the pts Xarelto is going to cost him $170 a month with his insurance and without insurance was quoted for $600.  Daughter is asking if a prior auth or pt assistance could be initiated for this medication, to lower the cost. Daughter states the pt has plenty supply of this medication on hand.  Daughter states if the cost can't be reduced, then the pt will need to be switched to a cheaper, alternative regimen. Informed the pts Daughter that I will route this message to our Prior [place] and Pharmacist here in the clinic, to work on getting the cost of Xarelto down for the pt.  Informed the pts Daughter that they will follow-up with either her or the pt, accordingly thereafter.  Daughter verbalized understanding and agrees with this plan.  OFF NOTE:  Daughter is not on DPR, but pt gave verbal consent.  They both were instructed next time pt is in the office, to ask for a DPR to fill out.  Both agreed to plan.

## 2020-03-03 NOTE — Telephone Encounter (Signed)
Patient wanting to discuss changing his Xarelto to a generic option if possible

## 2020-03-06 NOTE — Telephone Encounter (Signed)
**Note De-Identified  Obfuscation** The pts daughter Lenna Sciara and I discussed Warfarin which is the only generic anticoagulant on the market at this time and that all others are name brands. She is aware of the frequent INR visits/dosage changes that is required with Warfain and states that they are not interested in that medication as the pt is 87 years old.  We discussed pt asst for his Xarelto through [place] and Wynetta Emery but Lenna Sciara feels that her dads income will be too much for him to be approved for the Xarelto asst program.  I did provide her [employer] Pt Asst phone number as well as Engineer, maintenance (I did advise that this program would be a great option once the pt falls into his donut hole later this year) and encouraged her to call both [employer] as they are very helpful.   She thanked me for calling her and explaining the pts options and is aware to call Jeani Hawking back if she has any further questions or concerns.

## 2020-04-10 ENCOUNTER — Ambulatory Visit (INDEPENDENT_AMBULATORY_CARE_PROVIDER_SITE_OTHER): Payer: Medicare Other

## 2020-04-10 DIAGNOSIS — I441 Atrioventricular block, second degree: Secondary | ICD-10-CM | POA: Diagnosis not present

## 2020-04-10 LAB — CUP PACEART REMOTE DEVICE CHECK
Battery Remaining Longevity: 128 mo
Battery Remaining Percentage: 95.5 %
Battery Voltage: 3.04 V
Brady Statistic AP VP Percent: 19 %
Brady Statistic AP VS Percent: 1 %
Brady Statistic AS VP Percent: 66 %
Brady Statistic AS VS Percent: 14 %
Brady Statistic RA Percent Paced: 19 %
Brady Statistic RV Percent Paced: 86 %
Date Time Interrogation Session: 20220321054410
Implantable Lead Implant Date: 20210920
Implantable Lead Implant Date: 20210920
Implantable Lead Location: 753859
Implantable Lead Location: 753860
Implantable Pulse Generator Implant Date: 20210920
Lead Channel Impedance Value: 510 Ohm
Lead Channel Impedance Value: 790 Ohm
Lead Channel Pacing Threshold Amplitude: 0.375 V
Lead Channel Pacing Threshold Amplitude: 0.625 V
Lead Channel Pacing Threshold Pulse Width: 0.5 ms
Lead Channel Pacing Threshold Pulse Width: 0.5 ms
Lead Channel Sensing Intrinsic Amplitude: 12 mV
Lead Channel Sensing Intrinsic Amplitude: 5 mV
Lead Channel Setting Pacing Amplitude: 0.625
Lead Channel Setting Pacing Amplitude: 1.625
Lead Channel Setting Pacing Pulse Width: 0.5 ms
Lead Channel Setting Sensing Sensitivity: 2 mV
Pulse Gen Model: 2272
Pulse Gen Serial Number: 3860652

## 2020-04-12 ENCOUNTER — Other Ambulatory Visit: Payer: Self-pay | Admitting: *Deleted

## 2020-04-12 MED ORDER — RIVAROXABAN 20 MG PO TABS
20.0000 mg | ORAL_TABLET | Freq: Every day | ORAL | 1 refills | Status: DC
Start: 2020-04-12 — End: 2021-06-14

## 2020-04-12 NOTE — Telephone Encounter (Signed)
Xarelto 20mg  paper refill request received. Pt is 87 years old, weight-86.2kg, Crea-1.11 on 10/08/2019, last seen by Dr. Quentin Ore on 02/16/20, Diagnosis-Afib, CrCl-60.50ml/min; Dose is appropriate based on dosing criteria. Will send in refill to requested pharmacy.

## 2020-04-17 NOTE — Progress Notes (Signed)
Remote pacemaker transmission.   

## 2020-04-19 ENCOUNTER — Ambulatory Visit (INDEPENDENT_AMBULATORY_CARE_PROVIDER_SITE_OTHER): Payer: Medicare Other | Admitting: Cardiology

## 2020-04-19 ENCOUNTER — Encounter: Payer: Self-pay | Admitting: Cardiology

## 2020-04-19 ENCOUNTER — Other Ambulatory Visit: Payer: Self-pay

## 2020-04-19 VITALS — BP 110/68 | HR 75 | Ht 70.0 in | Wt 189.5 lb

## 2020-04-19 DIAGNOSIS — Z950 Presence of cardiac pacemaker: Secondary | ICD-10-CM | POA: Diagnosis not present

## 2020-04-19 DIAGNOSIS — I441 Atrioventricular block, second degree: Secondary | ICD-10-CM | POA: Diagnosis not present

## 2020-04-19 DIAGNOSIS — I442 Atrioventricular block, complete: Secondary | ICD-10-CM

## 2020-04-19 DIAGNOSIS — M79671 Pain in right foot: Secondary | ICD-10-CM | POA: Diagnosis not present

## 2020-04-19 MED ORDER — COLCHICINE 0.6 MG PO TABS
ORAL_TABLET | ORAL | 0 refills | Status: AC
Start: 2020-04-19 — End: ?

## 2020-04-19 NOTE — Patient Instructions (Addendum)
Medication Instructions:  Your physician has recommended you make the following change in your medication:   Gout treatment:  Colchicine 0.6 mg tablets:  1.  Take 2 tablets (1.2 mg) by mouth when you pick up your prescription  2.  2 hours after your first dose take 1 tablet (0.6 mg) by mouth  3.  12 hours after your last dose take 1 tablet (0.6 mg) by mouth  CALL your PCP for further medication/instructions  *If you need a refill on your cardiac medications before your next appointment, please call your pharmacy*  Lab Work: None ordered. If you have labs (blood work) drawn today and your tests are completely normal, you will receive your results only by: Marland Kitchen MyChart Message (if you have MyChart) OR . A paper copy in the mail If you have any lab test that is abnormal or we need to change your treatment, we will call you to review the results.  Testing/Procedures: None ordered.  Follow-Up: At [facility], you and your health needs are our priority.  As part of our continuing mission to provide you with exceptional heart care, we have created designated Provider Care Teams.  These Care Teams include your primary Cardiologist (physician) and Advanced Practice Providers (APPs -  Physician Assistants and Nurse Practitioners) who all work together to provide you with the care you need, when you need it.  Your next appointment:   Your physician wants you to follow-up in: one year with Dr. Quentin Ore.   You will receive a reminder letter in the mail two months in advance. If you don't receive a letter, please call our office to schedule the follow-up appointment.  Remote monitoring is used to monitor your Pacemaker from home. This monitoring reduces the number of office visits required to check your device to one time per year. It allows [place] to keep an eye on the functioning of your device to ensure it is working properly. You are scheduled for a device check from home on 07/10/2020. You may send  your transmission at any time that day. If you have a wireless device, the transmission will be sent automatically. After your physician reviews your transmission, you will receive a postcard with your next transmission date.

## 2020-04-19 NOTE — Progress Notes (Signed)
Electrophysiology Office Follow up Visit Note:    Date:  04/19/2020   ID:  Ronnie Lawrence, DOB Dec 29, 1927, MRN [id number]  PCP:  [name], MD  [place] Cardiologist:  No primary care provider on file.  [facility] Electrophysiologist:  Vickie Epley, MD    Interval History:    Ronnie Lawrence is a 87 y.o. male who presents for a follow up visit after pacemaker implant on October 11, 2019.  He has done well since implant.  Remote monitoring of his device has demonstrated a 10% burden of atrial fibrillation.  I last saw the patient on February 16, 2020.  At that appointment we discussed touching base today to reassess his atrial fibrillation/flutter burden and to consider cardioversion.  He is anticoagulated with Xarelto.  Today his biggest complaint is some right heel pain.  He tells me it is similar to his previous gout flares but just in a different location.    Past Medical History:  Diagnosis Date  . Asthma   . Diabetes mellitus   . Gout   . Hypertension   . Wears dentures    partial upper    Past Surgical History:  Procedure Laterality Date  . BACK SURGERY    . CATARACT EXTRACTION W/PHACO Left 08/31/2019   Procedure: CATARACT EXTRACTION PHACO AND INTRAOCULAR LENS PLACEMENT (IOC) LEFT DIABETIC 9.58 00:51.0;  Surgeon: Birder Robson, MD;  Location: [facility];  Service: Ophthalmology;  Laterality: Left;  Diabetic - oral meds  . CATARACT EXTRACTION W/PHACO Right 09/21/2019   Procedure: CATARACT EXTRACTION PHACO AND INTRAOCULAR LENS PLACEMENT (IOC) RIGHT DIABETIC 13.56  01:10.7;  Surgeon: Birder Robson, MD;  Location: [facility];  Service: Ophthalmology;  Laterality: Right;  . COLONOSCOPY N/A 08/13/2013   Procedure: COLONOSCOPY;  Surgeon: Rogene Houston, MD;  [facility];  Service: Endoscopy;  Laterality: N/A;  1050  . HERNIA REPAIR     bilateral  . PACEMAKER IMPLANT N/A 10/11/2019   Procedure: PACEMAKER IMPLANT;  Surgeon:  Vickie Epley, MD;  Location: [facility];  Service: Cardiovascular;  Laterality: N/A;    Current Medications: Current Meds  Medication Sig  . atorvastatin (LIPITOR) 10 MG tablet Take 10 mg by mouth at bedtime.  . colchicine 0.6 MG tablet Take 2 tablets (1.2 mg) by mouth when you pick up your prescription.  2 hours after your first dose take one tablet.  12 hours later take one tablet.  Call your PCP for further medication/instructions.  . magnesium gluconate (MAGONATE) 500 MG tablet Take 500 mg by mouth daily.  . metFORMIN (GLUCOPHAGE-XR) 500 MG 24 hr tablet Take 500 mg by mouth 2 (two) times daily.  . rivaroxaban (XARELTO) 20 MG TABS tablet Take 1 tablet (20 mg total) by mouth daily with supper.     Allergies:   Patient has no known allergies.   Social History   Socioeconomic History  . Marital status: Widowed    Spouse name: Not on file  . Number of children: Not on file  . Years of education: Not on file  . Highest education level: Not on file  Occupational History  . Not on file  Tobacco Use  . Smoking status: Former Smoker    Quit date: 1981    Years since quitting: 41.2  . Smokeless tobacco: Never Used  Vaping Use  . Vaping Use: Never used  Substance and Sexual Activity  . Alcohol use: Yes    Alcohol/week: 7.0 standard drinks  Types: 7 Standard drinks or equivalent per week  . Drug use: No  . Sexual activity: Not on file  Other Topics Concern  . Not on file  Social History Narrative  . Not on file   Social Determinants of Health   Financial Resource Strain: Not on file  Food Insecurity: Not on file  Transportation Needs: Not on file  Physical Activity: Not on file  Stress: Not on file  Social Connections: Not on file     Family History: The patient's family history is not on file.  ROS:   Please see the history of present illness.    All other systems reviewed and are negative.  EKGs/Labs/Other Studies Reviewed:    The following  studies were reviewed today:  April 19, 2020 device interrogation personally reviewed Lead parameters stable.  Battery longevity estimated at 10.5 to 10.8 years. DDD 60-1 10 Atrially pacing 18%, ventricularly pacing 85% Presenting rhythm a sensed V paced Review of his AT/AF burden shows that after mid January, the burden of atrial fibrillation reduced dramatically  EKG:  The ekg ordered today demonstrates a sensed, V paced rhythm with a PR interval of 240 ms  Recent Labs: 10/08/2019: BUN 20; Creatinine, Ser 1.11; Hemoglobin 16.4; Platelets 239; Potassium 4.8; Sodium 139  Recent Lipid Panel No results found for: CHOL, TRIG, HDL, CHOLHDL, VLDL, LDLCALC, LDLDIRECT  Physical Exam:    VS:  BP 110/68 (BP Location: Left Arm, Patient Position: Sitting, Cuff Size: Normal)   Pulse 75   Ht 5\' 10"  (1.778 m)   Wt 189 lb 8 oz (86 kg)   SpO2 98%   BMI 27.19 kg/m     Wt Readings from Last 3 Encounters:  04/19/20 189 lb 8 oz (86 kg)  02/16/20 190 lb (86.2 kg)  01/12/20 189 lb (85.7 kg)     GEN:  Well nourished, well developed in no acute distress HEENT: Normal NECK: No JVD; No carotid bruits LYMPHATICS: No lymphadenopathy CARDIAC: RRR, no murmurs, rubs, gallops RESPIRATORY:  Clear to auscultation without rales, wheezing or rhonchi  ABDOMEN: Soft, non-tender, non-distended MUSCULOSKELETAL:  No edema; No deformity.  Pain in the right heel starting at the insertion of the Achilles tendon and extending onto the plantar surface of his heel.  The area is mildly warm but not erythematous or swollen. SKIN: Warm and dry NEUROLOGIC:  Alert and oriented x 3 PSYCHIATRIC:  Normal affect   ASSESSMENT:    1. Heart block AV complete ([place])   2. Atrioventricular block, Mobitz type 1, Wenckebach   3. Cardiac pacemaker in situ   4. Pain of right heel    PLAN:    In order of problems listed above:  1. Complete heart block now post permanent pacemaker implant Pacemaker well-functioning on today's  interrogation. Plan follow-up 1 year.  Continue remote monitoring.  2.  Right heel pain Suspicious for gout flare given his history.  Will prescribe a short course of colchicine.  I have advised him that if the pain does not completely resolve with his colchicine, he should seek care with his primary care physician.  3.  Atrial fibrillation On Xarelto for stroke prophylaxis. Overall very low burden of A. fib on his device interrogation.  Total time spent with patient today 45 minutes. This includes reviewing records, evaluating the patient and coordinating care.   Medication Adjustments/Labs and Tests Ordered: Current medicines are reviewed at length with the patient today.  Concerns regarding medicines are outlined above.  Orders Placed This  Encounter  Procedures  . EKG 12-Lead   Meds ordered this encounter  Medications  . colchicine 0.6 MG tablet    Sig: Take 2 tablets (1.2 mg) by mouth when you pick up your prescription.  2 hours after your first dose take one tablet.  12 hours later take one tablet.  Call your PCP for further medication/instructions.    Dispense:  4 tablet    Refill:  0     Signed, Lars Mage, MD, [facility]  04/19/2020 11:00 AM    Electrophysiology [facility]

## 2020-04-26 ENCOUNTER — Ambulatory Visit: Payer: Medicare Other | Admitting: Cardiology

## 2020-07-10 ENCOUNTER — Ambulatory Visit (INDEPENDENT_AMBULATORY_CARE_PROVIDER_SITE_OTHER): Payer: Medicare Other

## 2020-07-10 DIAGNOSIS — I442 Atrioventricular block, complete: Secondary | ICD-10-CM | POA: Diagnosis not present

## 2020-07-11 LAB — CUP PACEART REMOTE DEVICE CHECK
Battery Remaining Longevity: 123 mo
Battery Remaining Percentage: 95 %
Battery Voltage: 3.04 V
Brady Statistic AP VP Percent: 14 %
Brady Statistic AP VS Percent: 1 %
Brady Statistic AS VP Percent: 69 %
Brady Statistic AS VS Percent: 17 %
Brady Statistic RA Percent Paced: 15 %
Brady Statistic RV Percent Paced: 83 %
Date Time Interrogation Session: 20220620020013
Implantable Lead Implant Date: 20210920
Implantable Lead Implant Date: 20210920
Implantable Lead Location: 753859
Implantable Lead Location: 753860
Implantable Pulse Generator Implant Date: 20210920
Lead Channel Impedance Value: 510 Ohm
Lead Channel Impedance Value: 690 Ohm
Lead Channel Pacing Threshold Amplitude: 0.375 V
Lead Channel Pacing Threshold Amplitude: 0.75 V
Lead Channel Pacing Threshold Pulse Width: 0.5 ms
Lead Channel Pacing Threshold Pulse Width: 0.5 ms
Lead Channel Sensing Intrinsic Amplitude: 12 mV
Lead Channel Sensing Intrinsic Amplitude: 5 mV
Lead Channel Setting Pacing Amplitude: 0.625
Lead Channel Setting Pacing Amplitude: 1.75 V
Lead Channel Setting Pacing Pulse Width: 0.5 ms
Lead Channel Setting Sensing Sensitivity: 2 mV
Pulse Gen Model: 2272
Pulse Gen Serial Number: 3860652

## 2020-07-27 NOTE — Progress Notes (Signed)
Remote pacemaker transmission.   

## 2020-10-09 ENCOUNTER — Ambulatory Visit (INDEPENDENT_AMBULATORY_CARE_PROVIDER_SITE_OTHER): Payer: Medicare Other

## 2020-10-09 DIAGNOSIS — I442 Atrioventricular block, complete: Secondary | ICD-10-CM | POA: Diagnosis not present

## 2020-10-10 LAB — CUP PACEART REMOTE DEVICE CHECK
Battery Remaining Longevity: 120 mo
Battery Remaining Percentage: 93 %
Battery Voltage: 3.04 V
Brady Statistic AP VP Percent: 13 %
Brady Statistic AP VS Percent: 1 %
Brady Statistic AS VP Percent: 69 %
Brady Statistic AS VS Percent: 17 %
Brady Statistic RA Percent Paced: 14 %
Brady Statistic RV Percent Paced: 82 %
Date Time Interrogation Session: 20220919031012
Implantable Lead Implant Date: 20210920
Implantable Lead Implant Date: 20210920
Implantable Lead Location: 753859
Implantable Lead Location: 753860
Implantable Pulse Generator Implant Date: 20210920
Lead Channel Impedance Value: 480 Ohm
Lead Channel Impedance Value: 700 Ohm
Lead Channel Pacing Threshold Amplitude: 0.375 V
Lead Channel Pacing Threshold Amplitude: 0.75 V
Lead Channel Pacing Threshold Pulse Width: 0.5 ms
Lead Channel Pacing Threshold Pulse Width: 0.5 ms
Lead Channel Sensing Intrinsic Amplitude: 12 mV
Lead Channel Sensing Intrinsic Amplitude: 5 mV
Lead Channel Setting Pacing Amplitude: 0.625
Lead Channel Setting Pacing Amplitude: 1.75 V
Lead Channel Setting Pacing Pulse Width: 0.5 ms
Lead Channel Setting Sensing Sensitivity: 2 mV
Pulse Gen Model: 2272
Pulse Gen Serial Number: 3860652

## 2020-10-11 ENCOUNTER — Encounter: Payer: Self-pay | Admitting: Cardiology

## 2020-10-11 ENCOUNTER — Ambulatory Visit (INDEPENDENT_AMBULATORY_CARE_PROVIDER_SITE_OTHER): Payer: Medicare Other | Admitting: Cardiology

## 2020-10-11 ENCOUNTER — Other Ambulatory Visit: Payer: Self-pay

## 2020-10-11 VITALS — BP 128/60 | HR 77 | Ht 70.0 in | Wt 182.0 lb

## 2020-10-11 DIAGNOSIS — I441 Atrioventricular block, second degree: Secondary | ICD-10-CM | POA: Diagnosis not present

## 2020-10-11 DIAGNOSIS — I442 Atrioventricular block, complete: Secondary | ICD-10-CM | POA: Diagnosis not present

## 2020-10-11 DIAGNOSIS — Z950 Presence of cardiac pacemaker: Secondary | ICD-10-CM

## 2020-10-11 LAB — PACEMAKER DEVICE OBSERVATION

## 2020-10-11 NOTE — Patient Instructions (Signed)
Medication Instructions:  Your physician recommends that you continue on your current medications as directed. Please refer to the Current Medication list given to you today. *If you need a refill on your cardiac medications before your next appointment, please call your pharmacy*  Lab Work: None ordered. If you have labs (blood work) drawn today and your tests are completely normal, you will receive your results only by: [place] (if you have MyChart) OR A paper copy in the mail If you have any lab test that is abnormal or we need to change your treatment, we will call you to review the results.  Testing/Procedures: None ordered.  Follow-Up: At [facility], you and your health needs are our priority.  As part of our continuing mission to provide you with exceptional heart care, we have created designated Provider Care Teams.  These Care Teams include your primary Cardiologist (physician) and Advanced Practice Providers (APPs -  Physician Assistants and Nurse Practitioners) who all work together to provide you with the care you need, when you need it.  Your next appointment:   Your physician wants you to follow-up in: one year with Dr. Quentin Ore or  You will see one of the following Advanced Practice Providers on your designated Care Team:   Murray Hodgkins, NP Christell Faith, PA-C Marrianne Mood, PA-C Cadence Kathlen Mody, PA-C You will receive a reminder letter in the mail two months in advance. If you don't receive a letter, please call our office to schedule the follow-up appointment.  Remote monitoring is used to monitor your Pacemaker from home. This monitoring reduces the number of office visits required to check your device to one time per year. It allows [place] to keep an eye on the functioning of your device to ensure it is working properly. You are scheduled for a device check from home on 01/08/2021. You may send your transmission at any time that day. If you have a wireless device,  the transmission will be sent automatically. After your physician reviews your transmission, you will receive a postcard with your next transmission date.

## 2020-10-11 NOTE — Progress Notes (Signed)
Electrophysiology Office Follow up Visit Note:    Date:  10/11/2020   ID:  Ronnie Lawrence, DOB 01/19/1928, MRN [id number]  PCP:  Carylon Perches, MD  [facility] Cardiologist:  None  [facility] Electrophysiologist:  Lanier Prude, MD    Interval History:    Ronnie Lawrence is a 87 y.o. male who presents for a follow up visit. They were last seen in clinic April 19, 2020.  He had a pacemaker implanted October 11, 2019.  He also has atrial fibrillation with a 10% burden of arrhythmia at that prior clinic appointment device interrogation.  He is with his daughter today in clinic left previously met.  He tells me he is overall done okay since I last saw him.  Energy level is low which is a chronic issue.  He also tells me he is starting to have trouble with memory and intermittent confusion.  He has a primary care appointment scheduled for tomorrow.   Past Medical History:  Diagnosis Date   Asthma    Diabetes mellitus    Gout    Hypertension    Wears dentures    partial upper    Past Surgical History:  Procedure Laterality Date   BACK SURGERY     CATARACT EXTRACTION W/PHACO Left 08/31/2019   Procedure: CATARACT EXTRACTION PHACO AND INTRAOCULAR LENS PLACEMENT (IOC) LEFT DIABETIC 9.58 00:51.0;  Surgeon: Galen Manila, MD;  Location: [facility];  Service: Ophthalmology;  Laterality: Left;  Diabetic - oral meds   CATARACT EXTRACTION W/PHACO Right 09/21/2019   Procedure: CATARACT EXTRACTION PHACO AND INTRAOCULAR LENS PLACEMENT (IOC) RIGHT DIABETIC 13.56  01:10.7;  Surgeon: Galen Manila, MD;  Location: [facility];  Service: Ophthalmology;  Laterality: Right;   COLONOSCOPY N/A 08/13/2013   Procedure: COLONOSCOPY;  Surgeon: Malissa Hippo, MD;  [facility];  Service: Endoscopy;  Laterality: N/A;  1050   HERNIA REPAIR     bilateral   PACEMAKER IMPLANT N/A 10/11/2019   Procedure: PACEMAKER IMPLANT;  Surgeon: Lanier Prude, MD;  Location:  [facility] INVAS[facility] CV LAB;  Service: Cardiovascular;  Laterality: N/A;    Current Medications: Current Meds  Medication Sig   atorvastatin (LIPITOR) 10 MG tablet Take 10 mg by mouth at bedtime.   magnesium gluconate (MAGONATE) 500 MG tablet Take 500 mg by mouth daily.   metFORMIN (GLUCOPHAGE-XR) 500 MG 24 hr tablet Take 500 mg by mouth 2 (two) times daily.   rivaroxaban (XARELTO) 20 MG TABS tablet Take 1 tablet (20 mg total) by mouth daily with supper.     Allergies:   Patient has no known allergies.   Social History   Socioeconomic History   Marital status: Widowed    Spouse name: Not on file   Number of children: Not on file   Years of education: Not on file   Highest education level: Not on file  Occupational History   Not on file  Tobacco Use   Smoking status: Former    Types: Cigarettes    Quit date: 2    Years since quitting: 41.7   Smokeless tobacco: Never  Vaping Use   Vaping Use: Never used  Substance and Sexual Activity   Alcohol use: Yes    Alcohol/week: 7.0 standard drinks    Types: 7 Standard drinks or equivalent per week   Drug use: No   Sexual activity: Not on file  Other Topics Concern   Not on file  Social History Narrative  Not on file   Social Determinants of Health   Financial Resource Strain: Not on file  Food Insecurity: Not on file  Transportation Needs: Not on file  Physical Activity: Not on file  Stress: Not on file  Social Connections: Not on file     Family History: The patient's family history is not on file.  ROS:   Please see the history of present illness.    All other systems reviewed and are negative.  EKGs/Labs/Other Studies Reviewed:    The following studies were reviewed today:  October 11, 2020 in clinic device interrogation personally reviewed Presenting rhythm sinus rhythm with ventricular pacing Battery longevity 10 years Lead parameters are all stable DDD 60-1 10 AF burden less than 1% since April 19, 2020  EKG:  The ekg ordered today demonstrates intermittent atrial pacing, consistent ventricular pacing  Recent Labs: No results found for requested labs within last 8760 hours.  Recent Lipid Panel No results found for: CHOL, TRIG, HDL, CHOLHDL, VLDL, LDLCALC, LDLDIRECT  Physical Exam:    VS:  BP 128/60 (BP Location: Left Arm, Patient Position: Sitting, Cuff Size: Normal)   Pulse 77   Ht $R'5\' 10"'Ej$  (1.778 m)   Wt 182 lb (82.6 kg)   SpO2 98%   BMI 26.11 kg/m     Wt Readings from Last 3 Encounters:  10/11/20 182 lb (82.6 kg)  04/19/20 189 lb 8 oz (86 kg)  02/16/20 190 lb (86.2 kg)     GEN:  Well nourished, well developed in no acute distress HEENT: Normal NECK: No JVD; No carotid bruits LYMPHATICS: No lymphadenopathy CARDIAC: RRR, no murmurs, rubs, gallops.  Pacemaker pocket well-healed RESPIRATORY:  Clear to auscultation without rales, wheezing or rhonchi  ABDOMEN: Soft, non-tender, non-distended MUSCULOSKELETAL:  No edema; No deformity  SKIN: Warm and dry NEUROLOGIC:  Alert and oriented x 3 PSYCHIATRIC:  Normal affect   ASSESSMENT:    1. Atrioventricular block, Mobitz type 1, Wenckebach   2. Heart block AV complete (HCC)   3. Cardiac pacemaker in situ    PLAN:    In order of problems listed above:  1. Atrioventricular block, Mobitz type 1, Wenckebach 2. Heart block AV complete (HCC) 3. Cardiac pacemaker in situ  Pacemaker functioning well on today's interrogation.  Site looks good on his left chest.  Continue remote monitoring.  I will see him back in clinic in 1 year or sooner as needed.    I have encouraged him to touch base with his primary care physician tomorrow about his intermittent confusion.    Medication Adjustments/Labs and Tests Ordered: Current medicines are reviewed at length with the patient today.  Concerns regarding medicines are outlined above.  No orders of the defined types were placed in this encounter.  No orders of the defined types  were placed in this encounter.    Signed, Lars Mage, MD, [facility], [facility] 10/11/2020 11:18 AM    Electrophysiology  [facility]

## 2020-10-12 ENCOUNTER — Other Ambulatory Visit (HOSPITAL_COMMUNITY): Payer: Self-pay | Admitting: Internal Medicine

## 2020-10-12 ENCOUNTER — Other Ambulatory Visit: Payer: Self-pay | Admitting: Internal Medicine

## 2020-10-12 DIAGNOSIS — G3184 Mild cognitive impairment, so stated: Secondary | ICD-10-CM

## 2020-10-12 NOTE — Progress Notes (Signed)
Remote pacemaker transmission.   

## 2020-11-03 ENCOUNTER — Ambulatory Visit (HOSPITAL_COMMUNITY)
Admission: RE | Admit: 2020-11-03 | Discharge: 2020-11-03 | Disposition: A | Discharge status: Home / Self Care | Payer: Medicare Other | Source: Ambulatory Visit | Attending: Internal Medicine | Admitting: Internal Medicine

## 2020-11-03 ENCOUNTER — Other Ambulatory Visit: Payer: Self-pay

## 2020-11-03 DIAGNOSIS — I6782 Cerebral ischemia: Secondary | ICD-10-CM | POA: Insufficient documentation

## 2020-11-03 DIAGNOSIS — G3184 Mild cognitive impairment, so stated: Secondary | ICD-10-CM

## 2020-11-21 ENCOUNTER — Other Ambulatory Visit (HOSPITAL_COMMUNITY): Payer: Self-pay | Admitting: Internal Medicine

## 2020-11-21 DIAGNOSIS — Z8673 Personal history of transient ischemic attack (TIA), and cerebral infarction without residual deficits: Secondary | ICD-10-CM

## 2020-11-30 ENCOUNTER — Other Ambulatory Visit: Payer: Self-pay

## 2020-11-30 ENCOUNTER — Ambulatory Visit (HOSPITAL_COMMUNITY)
Admission: RE | Admit: 2020-11-30 | Discharge: 2020-11-30 | Disposition: A | Discharge status: Home / Self Care | Payer: Medicare Other | Source: Ambulatory Visit | Attending: Internal Medicine | Admitting: Internal Medicine

## 2020-11-30 DIAGNOSIS — Z8673 Personal history of transient ischemic attack (TIA), and cerebral infarction without residual deficits: Secondary | ICD-10-CM | POA: Diagnosis present

## 2021-01-08 ENCOUNTER — Ambulatory Visit (INDEPENDENT_AMBULATORY_CARE_PROVIDER_SITE_OTHER): Payer: Medicare Other

## 2021-01-08 DIAGNOSIS — I442 Atrioventricular block, complete: Secondary | ICD-10-CM

## 2021-01-08 LAB — CUP PACEART REMOTE DEVICE CHECK
Battery Remaining Longevity: 117 mo
Battery Remaining Percentage: 90 %
Battery Voltage: 3.02 V
Brady Statistic AP VP Percent: 11 %
Brady Statistic AP VS Percent: 1.2 %
Brady Statistic AS VP Percent: 71 %
Brady Statistic AS VS Percent: 16 %
Brady Statistic RA Percent Paced: 13 %
Brady Statistic RV Percent Paced: 83 %
Date Time Interrogation Session: 20221219025333
Implantable Lead Implant Date: 20210920
Implantable Lead Implant Date: 20210920
Implantable Lead Location: 753859
Implantable Lead Location: 753860
Implantable Pulse Generator Implant Date: 20210920
Lead Channel Impedance Value: 510 Ohm
Lead Channel Impedance Value: 700 Ohm
Lead Channel Pacing Threshold Amplitude: 0.375 V
Lead Channel Pacing Threshold Amplitude: 0.625 V
Lead Channel Pacing Threshold Pulse Width: 0.5 ms
Lead Channel Pacing Threshold Pulse Width: 0.5 ms
Lead Channel Sensing Intrinsic Amplitude: 12 mV
Lead Channel Sensing Intrinsic Amplitude: 4.7 mV
Lead Channel Setting Pacing Amplitude: 0.625
Lead Channel Setting Pacing Amplitude: 1.625
Lead Channel Setting Pacing Pulse Width: 0.5 ms
Lead Channel Setting Sensing Sensitivity: 2 mV
Pulse Gen Model: 2272
Pulse Gen Serial Number: 3860652

## 2021-01-17 NOTE — Progress Notes (Signed)
Remote pacemaker transmission.   

## 2021-03-01 ENCOUNTER — Inpatient Hospital Stay (HOSPITAL_COMMUNITY): Payer: Medicare Other | Attending: Hematology | Admitting: Hematology

## 2021-03-01 ENCOUNTER — Inpatient Hospital Stay (HOSPITAL_COMMUNITY): Payer: Medicare Other

## 2021-03-01 ENCOUNTER — Encounter (HOSPITAL_COMMUNITY): Payer: Self-pay | Admitting: Hematology

## 2021-03-01 ENCOUNTER — Other Ambulatory Visit: Payer: Self-pay

## 2021-03-01 VITALS — BP 100/61 | HR 90 | Resp 18 | Ht 70.0 in | Wt 165.8 lb

## 2021-03-01 DIAGNOSIS — Z803 Family history of malignant neoplasm of breast: Secondary | ICD-10-CM | POA: Diagnosis not present

## 2021-03-01 DIAGNOSIS — Z7901 Long term (current) use of anticoagulants: Secondary | ICD-10-CM | POA: Insufficient documentation

## 2021-03-01 DIAGNOSIS — Z7984 Long term (current) use of oral hypoglycemic drugs: Secondary | ICD-10-CM | POA: Insufficient documentation

## 2021-03-01 DIAGNOSIS — Z800 Family history of malignant neoplasm of digestive organs: Secondary | ICD-10-CM | POA: Diagnosis not present

## 2021-03-01 DIAGNOSIS — J45909 Unspecified asthma, uncomplicated: Secondary | ICD-10-CM | POA: Insufficient documentation

## 2021-03-01 DIAGNOSIS — R634 Abnormal weight loss: Secondary | ICD-10-CM | POA: Insufficient documentation

## 2021-03-01 DIAGNOSIS — M109 Gout, unspecified: Secondary | ICD-10-CM | POA: Insufficient documentation

## 2021-03-01 DIAGNOSIS — D72829 Elevated white blood cell count, unspecified: Secondary | ICD-10-CM | POA: Diagnosis not present

## 2021-03-01 DIAGNOSIS — E119 Type 2 diabetes mellitus without complications: Secondary | ICD-10-CM | POA: Insufficient documentation

## 2021-03-01 DIAGNOSIS — Z87891 Personal history of nicotine dependence: Secondary | ICD-10-CM | POA: Insufficient documentation

## 2021-03-01 DIAGNOSIS — I441 Atrioventricular block, second degree: Secondary | ICD-10-CM | POA: Diagnosis not present

## 2021-03-01 DIAGNOSIS — R630 Anorexia: Secondary | ICD-10-CM | POA: Diagnosis not present

## 2021-03-01 DIAGNOSIS — I10 Essential (primary) hypertension: Secondary | ICD-10-CM | POA: Diagnosis not present

## 2021-03-01 DIAGNOSIS — Z950 Presence of cardiac pacemaker: Secondary | ICD-10-CM | POA: Diagnosis not present

## 2021-03-01 DIAGNOSIS — D72825 Bandemia: Secondary | ICD-10-CM

## 2021-03-01 LAB — CBC WITH DIFFERENTIAL/PLATELET
Abs Immature Granulocytes: 0.2 10*3/uL — ABNORMAL HIGH (ref 0.00–0.07)
Basophils Absolute: 0.1 10*3/uL (ref 0.0–0.1)
Basophils Relative: 1 %
Eosinophils Absolute: 0.2 10*3/uL (ref 0.0–0.5)
Eosinophils Relative: 1 %
HCT: 46.5 % (ref 39.0–52.0)
Hemoglobin: 15 g/dL (ref 13.0–17.0)
Immature Granulocytes: 1 %
Lymphocytes Relative: 10 %
Lymphs Abs: 2.2 10*3/uL (ref 0.7–4.0)
MCH: 32.1 pg (ref 26.0–34.0)
MCHC: 32.3 g/dL (ref 30.0–36.0)
MCV: 99.6 fL (ref 80.0–100.0)
Monocytes Absolute: 1.7 10*3/uL — ABNORMAL HIGH (ref 0.1–1.0)
Monocytes Relative: 7 %
Neutro Abs: 18.3 10*3/uL — ABNORMAL HIGH (ref 1.7–7.7)
Neutrophils Relative %: 80 %
Platelets: 397 10*3/uL (ref 150–400)
RBC: 4.67 MIL/uL (ref 4.22–5.81)
RDW: 13.6 % (ref 11.5–15.5)
WBC: 22.7 10*3/uL — ABNORMAL HIGH (ref 4.0–10.5)
nRBC: 0 % (ref 0.0–0.2)

## 2021-03-01 LAB — URIC ACID: Uric Acid, Serum: 5.5 mg/dL (ref 3.7–8.6)

## 2021-03-01 LAB — C-REACTIVE PROTEIN: CRP: 3.9 mg/dL — ABNORMAL HIGH (ref <1.0)

## 2021-03-01 LAB — SEDIMENTATION RATE: Sed Rate: 26 mm/hr — ABNORMAL HIGH (ref 0–16)

## 2021-03-01 LAB — LACTATE DEHYDROGENASE: LDH: 110 U/L (ref 98–192)

## 2021-03-01 NOTE — Patient Instructions (Signed)
[place] at [facility] Discharge Instructions   You were seen and examined today by Dr. Delton Coombes. He is a blood specialist who is seeing you today for your elevated white blood cell count.  Dr. Willey Blade sent [place] records showing your white count being as high as 23,000.  Normal is under 10,000.  We will obtain additional blood work today to investigate why this blood count may be elevated.   Return to the clinic as scheduled to review the results of these lab results.    Thank you for choosing [place] at [facility] to provide your oncology and hematology care.  To afford each patient quality time with our provider, please arrive at least 15 minutes before your scheduled appointment time.   If you have a lab appointment with the [facility] please come in thru the Main Entrance and check in at the main information desk.  You need to re-schedule your appointment should you arrive 10 or more minutes late.  We strive to give you quality time with our providers, and arriving late affects you and other patients whose appointments are after yours.  Also, if you no show three or more times for appointments you may be dismissed from the clinic at the providers discretion.     Again, thank you for choosing [facility].  Our hope is that these requests will decrease the amount of time that you wait before being seen by our physicians.       _____________________________________________________________  Should you have questions after your visit to [facility], please contact our office at [phone] and follow the prompts.  Our office hours are 8:00 a.m. and 4:30 p.m. Monday - Friday.  Please note that voicemails left after 4:00 p.m. may not be returned until the following business day.  We are closed weekends and major holidays.  You do have access to a nurse 24-7, just call the main number to the clinic [phone] and do  not press any options, hold on the line and a nurse will answer the phone.    For prescription refill requests, have your pharmacy contact our office and allow 72 hours.    Due to Covid, you will need to wear a mask upon entering the hospital. If you do not have a mask, a mask will be given to you at the Main Entrance upon arrival. For doctor visits, patients may have 1 support person age 23 or older with them. For treatment visits, patients can not have anyone with them due to social distancing guidelines and our immunocompromised population.

## 2021-03-01 NOTE — Progress Notes (Signed)
[facility] [address], [place] [address]   CLINIC:  Medical Oncology/Hematology  Patient Care Team: [name], MD as PCP - General (Internal Medicine) Vickie Epley, MD as PCP - Electrophysiology (Cardiology)  CHIEF COMPLAINTS/PURPOSE OF CONSULTATION:  Evaluation of elevated WBC  HISTORY OF PRESENTING ILLNESS:  Ronnie Lawrence 88 y.o. male is here because of evaluation of elevated WBC, at the request of Dr. Willey Blade.  Today he reports feeling well, and he is accompanied by his daughter. He has a history of gout and reports intermittent swelling in his left ankle over the past 6 months. When the ankle is swollen, he needs assistance to walk or he uses a wheelchair. His left ankle is currently swollen and has been swollen for 1 month. He has lost 30 lbs in the past 6 months, and he reports poor appetite. He denies recent infection, fevers, and night sweats. He is currently on Xarelto for 2 years due to his pacemaker. He had 1 episode of nosebleed this morning lasting 15-20 minutes; he denies history of nosebleeds. He denies history of RA and lupus. He denies history of blood transfusions, and he reports history of phlebotomies. He reports the last time he took prednisone was 6 months ago. He denies splenectomy.   He currently lives at home on his own, and his able to do his typical home activities. He still occasionally drives. Prior to retirement he worked for [employer]. He quit smoking in 1981 after smoking 1 ppd for 30 years. His sister had metastatic breast cancer, and his brother had colon cancer.    MEDICAL HISTORY:  Past Medical History:  Diagnosis Date   Asthma    Diabetes mellitus    Gout    Hypertension    Wears dentures    partial upper    SURGICAL HISTORY: Past Surgical History:  Procedure Laterality Date   BACK SURGERY     CATARACT EXTRACTION W/PHACO Left 08/31/2019   Procedure: CATARACT EXTRACTION PHACO AND INTRAOCULAR LENS PLACEMENT (IOC) LEFT  DIABETIC 9.58 00:51.0;  Surgeon: Birder Robson, MD;  Location: [facility];  Service: Ophthalmology;  Laterality: Left;  Diabetic - oral meds   CATARACT EXTRACTION W/PHACO Right 09/21/2019   Procedure: CATARACT EXTRACTION PHACO AND INTRAOCULAR LENS PLACEMENT (IOC) RIGHT DIABETIC 13.56  01:10.7;  Surgeon: Birder Robson, MD;  Location: [facility];  Service: Ophthalmology;  Laterality: Right;   COLONOSCOPY N/A 08/13/2013   Procedure: COLONOSCOPY;  Surgeon: Rogene Houston, MD;  [facility];  Service: Endoscopy;  Laterality: N/A;  [place]     bilateral   PACEMAKER IMPLANT N/A 10/11/2019   Procedure: PACEMAKER IMPLANT;  Surgeon: Vickie Epley, MD;  Location: [facility];  Service: Cardiovascular;  Laterality: N/A;    SOCIAL HISTORY: Social History   Socioeconomic History   Marital status: Widowed    Spouse name: Not on file   Number of children: Not on file   Years of education: Not on file   Highest education level: Not on file  Occupational History   Not on file  Tobacco Use   Smoking status: Former    Types: Cigarettes    Quit date: 98    Years since quitting: 42.1   Smokeless tobacco: Never  Vaping Use   Vaping Use: Never used  Substance and Sexual Activity   Alcohol use: Yes    Alcohol/week: 4.0 standard drinks    Types: 4 Standard drinks  or equivalent per week    Comment: 3-4 a week   Drug use: No   Sexual activity: Not on file  Other Topics Concern   Not on file  Social History Narrative   Not on file   Social Determinants of Health   Financial Resource Strain: Not on file  Food Insecurity: Not on file  Transportation Needs: Not on file  Physical Activity: Not on file  Stress: Not on file  Social Connections: Not on file  Intimate Partner Violence: Not on file    FAMILY HISTORY: History reviewed. No pertinent family history.  ALLERGIES:  has No Known Allergies.  MEDICATIONS:  Current Outpatient  Medications  Medication Sig Dispense Refill   allopurinol (ZYLOPRIM) 100 MG tablet Take 200 mg by mouth daily.     atorvastatin (LIPITOR) 10 MG tablet Take 10 mg by mouth at bedtime.     colchicine 0.6 MG tablet Take 2 tablets (1.2 mg) by mouth when you pick up your prescription.  2 hours after your first dose take one tablet.  12 hours later take one tablet.  Call your PCP for further medication/instructions. 4 tablet 0   magnesium gluconate (MAGONATE) 500 MG tablet Take 500 mg by mouth daily.     metFORMIN (GLUCOPHAGE-XR) 500 MG 24 hr tablet Take 500 mg by mouth 2 (two) times daily.     rivaroxaban (XARELTO) 20 MG TABS tablet Take 1 tablet (20 mg total) by mouth daily with supper. 90 tablet 1   No current facility-administered medications for this visit.    REVIEW OF SYSTEMS:   Review of Systems  Constitutional:  Positive for appetite change and fatigue.  HENT:   Positive for nosebleeds (x1).   Gastrointestinal:  Positive for diarrhea.  Musculoskeletal:  Positive for arthralgias (10/10 L foot and leg).  Neurological:  Positive for dizziness.  Psychiatric/Behavioral:  Positive for sleep disturbance.   All other systems reviewed and are negative.   PHYSICAL EXAMINATION: ECOG PERFORMANCE STATUS: 1 - Symptomatic but completely ambulatory  Vitals:   03/01/21 1311  BP: 100/61  Pulse: 90  Resp: 18  SpO2: 91%   Filed Weights   03/01/21 1311  Weight: 165 lb 12.8 oz (75.2 kg)   Physical Exam Vitals reviewed.  Constitutional:      Appearance: Normal appearance.     Comments: In wheelchair  Cardiovascular:     Rate and Rhythm: Normal rate and regular rhythm.     Pulses: Normal pulses.     Heart sounds: Normal heart sounds.  Pulmonary:     Effort: Pulmonary effort is normal.     Breath sounds: Normal breath sounds.  Abdominal:     Palpations: Abdomen is soft. There is no hepatomegaly, splenomegaly or mass.     Tenderness: There is no abdominal tenderness.  Musculoskeletal:      Right lower leg: No edema.     Left lower leg: Swelling present.  Lymphadenopathy:     Upper Body:     Right upper body: No supraclavicular, axillary or pectoral adenopathy.     Left upper body: No supraclavicular, axillary or pectoral adenopathy.     Lower Body: No right inguinal adenopathy. No left inguinal adenopathy.  Neurological:     General: No focal deficit present.     Mental Status: He is alert and oriented to person, place, and time.  Psychiatric:        Mood and Affect: Mood normal.        Behavior: Behavior normal.  LABORATORY DATA:  I have reviewed the data as listed Recent Results (from the past 2160 hour(s))  CUP PACEART REMOTE DEVICE CHECK     Status: None   Collection Time: 01/08/21  2:53 AM  Result Value Ref Range   Date Time Interrogation Session [id number]    Pulse Generator Manufacturer [employer]    Pulse Gen Model 2272 Assurity MRI    Pulse Gen Serial Number [id number]    Clinic Name [facility]    Implantable Pulse Generator Type Implantable Pulse Generator    Implantable Pulse Generator Implant Date [id number]    Implantable Lead Manufacturer [facility]    Implantable Lead Model LPA1200M Tendril MRI    Implantable Lead Serial Number [id number]    Implantable Lead Implant Date [id number]    Implantable Lead Location Detail 1 UNKNOWN    Implantable Lead Location [id number]    Implantable Lead Manufacturer [facility]    Implantable Lead Model LPA1200M Tendril MRI    Implantable Lead Serial Number [id number]    Implantable Lead Implant Date [id number]    Implantable Lead Location Detail 1 UNKNOWN    Implantable Lead Location [id number]    Lead Channel Setting Sensing Sensitivity 2.0 mV   Lead Channel Setting Sensing Adaptation Mode Fixed Pacing    Lead Channel Setting Pacing Amplitude 1.625    Lead Channel Setting Pacing Pulse Width 0.5 ms   Lead Channel Setting Pacing Amplitude 0.625    Lead Channel Status     Lead Channel Impedance Value 510 ohm   Lead Channel  Sensing Intrinsic Amplitude 4.7 mV   Lead Channel Pacing Threshold Amplitude 0.625 V   Lead Channel Pacing Threshold Pulse Width 0.5 ms   Lead Channel Status     Lead Channel Impedance Value 700 ohm   Lead Channel Sensing Intrinsic Amplitude 12.0 mV   Lead Channel Pacing Threshold Amplitude 0.375 V   Lead Channel Pacing Threshold Pulse Width 0.5 ms   Battery Status MOS    Battery Remaining Longevity 117 mo   Battery Remaining Percentage 90.0 %   Battery Voltage 3.02 V   Brady Statistic RA Percent Paced 13.0 %   Brady Statistic RV Percent Paced 83.0 %   Brady Statistic AP VP Percent 11.0 %   Brady Statistic AS VP Percent 71.0 %   Brady Statistic AP VS Percent 1.2 %   Brady Statistic AS VS Percent 16.0 %    RADIOGRAPHIC STUDIES: I have personally reviewed the radiological images as listed and agreed with the findings in the report. No results found.  ASSESSMENT:  Elevated WBC: - Seen at the request of Dr. Willey Blade for leukocytosis. - CBC on 02/12/2021 with white count 23.5, normal hemoglobin and platelet count.  Differential with increased neutrophils and monocytes.  No blasts reported. - CBC on 01/04/2021 white count 19.8, increased absolute neutrophil count - No history of splenectomy.  Not on systemic steroids. - Weight loss 25 pounds in the last 6 months due to decreased appetite.  No fevers or night sweats. - Reports phlebotomies done in the 1980s, due to "too much blood". - Has AV block, Mobitz type I, Wenckebach, status post pacemaker on 10/11/2019 and on Xarelto   Social/family history: - Lives by himself, able to do all ADLs and IADLs.  Drives occasionally. 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  Smoked 1 pack/day for 30 years. - Sister had metastatic breast cancer and brother had colon cancer.    PLAN:  Neutrophilic leukocytosis: -  I have reviewed labs over the last few years.  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  Prior to that  CBC in September 2021 was normal. - Physical examination with no palpable hepato or splenomegaly.  No palpable adenopathy. - We will repeat CBC with differential along with LDH.  We will check for myeloproliferative disorders including BCR/ABL by FISH, JAK2 V617F and reflex testing.  We will also check ESR and CRP.  2.  Left ankle swelling and pain: - Reports left ankle gout limiting for the past 6 months off-and-on.  Has been having pain for last 1 month.  He is on colchicine and allopurinol.  Will obtain uric acid level.   All questions were answered. The patient knows to call the clinic with any problems, questions or concerns.  Derek Jack, MD 03/01/21 1:38 PM  [place] [phone]   I, Thana Ates, am acting as a scribe for Dr. Derek Jack.  I, Derek Jack MD, have reviewed the above documentation for accuracy and completeness, and I agree with the above.

## 2021-03-06 LAB — BCR-ABL1 FISH
Cells Analyzed: 200
Cells Counted: 200

## 2021-03-13 LAB — CALR + JAK2 E12-15 + MPL (REFLEXED)

## 2021-03-13 LAB — JAK2 V617F, W REFLEX TO CALR/E12/MPL

## 2021-03-29 ENCOUNTER — Other Ambulatory Visit: Payer: Self-pay

## 2021-03-29 ENCOUNTER — Inpatient Hospital Stay (HOSPITAL_COMMUNITY): Payer: Medicare Other | Attending: Hematology | Admitting: Hematology

## 2021-03-29 VITALS — BP 114/52 | HR 68 | Temp 98.1°F | Resp 20 | Ht 69.29 in | Wt 164.8 lb

## 2021-03-29 DIAGNOSIS — Z79899 Other long term (current) drug therapy: Secondary | ICD-10-CM | POA: Insufficient documentation

## 2021-03-29 DIAGNOSIS — I4430 Unspecified atrioventricular block: Secondary | ICD-10-CM | POA: Diagnosis not present

## 2021-03-29 DIAGNOSIS — D471 Chronic myeloproliferative disease: Secondary | ICD-10-CM | POA: Insufficient documentation

## 2021-03-29 DIAGNOSIS — D72829 Elevated white blood cell count, unspecified: Secondary | ICD-10-CM

## 2021-03-29 DIAGNOSIS — Z7901 Long term (current) use of anticoagulants: Secondary | ICD-10-CM | POA: Insufficient documentation

## 2021-03-29 DIAGNOSIS — M109 Gout, unspecified: Secondary | ICD-10-CM | POA: Diagnosis not present

## 2021-03-29 NOTE — Progress Notes (Signed)
[place], [place] [address]   CLINIC:  Medical Oncology/Hematology  PCP:  [name], MD [address] / [place] [address]  [phone]  REASON FOR VISIT:  Follow-up for leukocytosis  PRIOR THERAPY: none  CURRENT THERAPY: surveillance  INTERVAL HISTORY:  Ronnie Lawrence, a 88 y.o. male, returns for routine follow-up for his leukocytosis. Ronnie Lawrence was last seen on 03/01/2021.  Today he reports feeling good.   REVIEW OF SYSTEMS:  Review of Systems  Constitutional:  Negative for appetite change and fatigue.  All other systems reviewed and are negative.  PAST MEDICAL/SURGICAL HISTORY:  Past Medical History:  Diagnosis Date   Asthma    Diabetes mellitus    Gout    Hypertension    Wears dentures    partial upper   Past Surgical History:  Procedure Laterality Date   BACK SURGERY     CATARACT EXTRACTION W/PHACO Left 08/31/2019   Procedure: CATARACT EXTRACTION PHACO AND INTRAOCULAR LENS PLACEMENT (IOC) LEFT DIABETIC 9.58 00:51.0;  Surgeon: Birder Robson, MD;  Location: [facility];  Service: Ophthalmology;  Laterality: Left;  Diabetic - oral meds   CATARACT EXTRACTION W/PHACO Right 09/21/2019   Procedure: CATARACT EXTRACTION PHACO AND INTRAOCULAR LENS PLACEMENT (IOC) RIGHT DIABETIC 13.56  01:10.7;  Surgeon: Birder Robson, MD;  Location: [facility];  Service: Ophthalmology;  Laterality: Right;   COLONOSCOPY N/A 08/13/2013   Procedure: COLONOSCOPY;  Surgeon: Rogene Houston, MD;  [facility];  Service: Endoscopy;  Laterality: N/A;  [place]     bilateral   PACEMAKER IMPLANT N/A 10/11/2019   Procedure: PACEMAKER IMPLANT;  Surgeon: Vickie Epley, MD;  Location: [facility];  Service: Cardiovascular;  Laterality: N/A;    SOCIAL HISTORY:  Social History   Socioeconomic History   Marital status: Widowed    Spouse name: Not on file   Number of children: Not on  file   Years of education: Not on file   Highest education level: Not on file  Occupational History   Not on file  Tobacco Use   Smoking status: Former    Types: Cigarettes    Quit date: 55    Years since quitting: 42.2   Smokeless tobacco: Never  Vaping Use   Vaping Use: Never used  Substance and Sexual Activity   Alcohol use: Yes    Alcohol/week: 4.0 standard drinks    Types: 4 Standard drinks or equivalent per week    Comment: 3-4 a week   Drug use: No   Sexual activity: Not on file  Other Topics Concern   Not on file  Social History Narrative   Not on file   Social Determinants of Health   Financial Resource Strain: Not on file  Food Insecurity: Not on file  Transportation Needs: Not on file  Physical Activity: Not on file  Stress: Not on file  Social Connections: Not on file  Intimate Partner Violence: Not on file    FAMILY HISTORY:  No family history on file.  CURRENT MEDICATIONS:  Current Outpatient Medications  Medication Sig Dispense Refill   allopurinol (ZYLOPRIM) 300 MG tablet Take 300 mg by mouth daily.     atorvastatin (LIPITOR) 10 MG tablet Take 10 mg by mouth at bedtime.     colchicine 0.6 MG tablet Take 2 tablets (1.2 mg) by mouth when you pick up your prescription.  2 hours after your first dose  take one tablet.  12 hours later take one tablet.  Call your PCP for further medication/instructions. 4 tablet 0   magnesium gluconate (MAGONATE) 500 MG tablet Take 500 mg by mouth daily.     metFORMIN (GLUCOPHAGE-XR) 500 MG 24 hr tablet Take 500 mg by mouth 2 (two) times daily.     predniSONE (DELTASONE) 10 MG tablet PLEASE SEE ATTACHED FOR DETAILED DIRECTIONS     rivaroxaban (XARELTO) 20 MG TABS tablet Take 1 tablet (20 mg total) by mouth daily with supper. 90 tablet 1   No current facility-administered medications for this visit.    ALLERGIES:  No Known Allergies  PHYSICAL EXAM:  Performance status (ECOG): 1 - Symptomatic but completely  ambulatory  Vitals:   03/29/21 1123  BP: (!) 114/52  Pulse: 68  Resp: 20  Temp: 98.1 F (36.7 C)  SpO2: 97%   Wt Readings from Last 3 Encounters:  03/29/21 164 lb 12.8 oz (74.8 kg)  03/01/21 165 lb 12.8 oz (75.2 kg)  10/11/20 182 lb (82.6 kg)   Physical Exam Vitals reviewed.  Constitutional:      Appearance: Normal appearance.  Cardiovascular:     Rate and Rhythm: Normal rate and regular rhythm.     Pulses: Normal pulses.     Heart sounds: Normal heart sounds.  Pulmonary:     Effort: Pulmonary effort is normal.     Breath sounds: Normal breath sounds.  Neurological:     General: No focal deficit present.     Mental Status: He is alert and oriented to person, place, and time.  Psychiatric:        Mood and Affect: Mood normal.        Behavior: Behavior normal.    LABORATORY DATA:  I have reviewed the labs as listed.  CBC Latest Ref Rng & Units 03/01/2021 10/08/2019 09/05/2010  WBC 4.0 - 10.5 K/uL 22.7(H) 8.9 10.1  Hemoglobin 13.0 - 17.0 g/dL 15.0 16.4 14.5  Hematocrit 39.0 - 52.0 % 46.5 48.6 44.6  Platelets 150 - 400 K/uL 397 239 198   CMP Latest Ref Rng & Units 10/08/2019 09/05/2010  Glucose 70 - 99 mg/dL 126(H) 168(H)  BUN 8 - 23 mg/dL 20 23  Creatinine 0.61 - 1.24 mg/dL 1.11 1.03  Sodium 135 - 145 mmol/L 139 142  Potassium 3.5 - 5.1 mmol/L 4.8 4.0  Chloride 98 - 111 mmol/L 104 105  CO2 22 - 32 mmol/L 26 24  Calcium 8.9 - 10.3 mg/dL 9.5 9.3      Component Value Date/Time   RBC 4.67 03/01/2021 1415   MCV 99.6 03/01/2021 1415   MCH 32.1 03/01/2021 1415   MCHC 32.3 03/01/2021 1415   RDW 13.6 03/01/2021 1415   LYMPHSABS 2.2 03/01/2021 1415   MONOABS 1.7 (H) 03/01/2021 1415   EOSABS 0.2 03/01/2021 1415   BASOSABS 0.1 03/01/2021 1415    DIAGNOSTIC IMAGING:  I have independently reviewed the scans and discussed with the patient. No results found.   ASSESSMENT:  CALR mutation positive MPN: - Seen at the request of Dr. Willey Blade for leukocytosis. - CBC on  02/12/2021 with white count 23.5, normal hemoglobin and platelet count.  Differential with increased neutrophils and monocytes.  No blasts reported. - CBC on 01/04/2021 white count 19.8, increased absolute neutrophil count - No history of splenectomy.  Not on systemic steroids. - Weight loss 25 pounds in the last 6 months due to decreased appetite.  No fevers or night sweats. - Reports phlebotomies done  in the 1980s, due to "too much blood". 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    Social/family history: - Lives by himself, able to do all ADLs and IADLs.  Drives occasionally. 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  Smoked 1 pack/day for 30 years. - Sister had metastatic breast cancer and brother had colon cancer.     PLAN:  CALR mutation positive MPN: - We have reviewed results of prior blood work.  JAK2 V617F, BCR/ABL were negative.  However he had deletion mutation in the CALR gene.  This is consistent with myeloproliferative neoplasm. - Repeat CBC showed white count is 22.7.  Hemoglobin, and platelet count are normal.  Differential shows predominantly neutrophils and some monocytes.  ESR and CRP is also elevated. - We discussed his new diagnosis and prognosis in detail. - Based on age he is high risk.  However he did not have any prior history of thrombosis. - As he has a normal hemoglobin and platelets, I did not recommend cytoreductive therapy with hydroxyurea. - He is already on Xarelto 20 mg daily for AV block. - I have recommended follow-up in 4 months with repeat CBC and LDH.  If there is hemoglobin and hematocrit continued to increase, will consider phlebotomies.  If he comes off of Xarelto, he was recommended to start aspirin 81 mg daily.  2.  Left ankle swelling and pain: - This has improved.  Uric acid is within normal limits.  Continue colchicine and  allopurinol.  Orders placed this encounter:  No orders of the defined types were placed in this encounter.    Derek Jack, MD [place] [phone]   I, Thana Ates, am acting as a scribe for Dr. Derek Jack.  I, Derek Jack MD, have reviewed the above documentation for accuracy and completeness, and I agree with the above.

## 2021-03-29 NOTE — Patient Instructions (Addendum)
[place] at [facility] ?Discharge Instructions ? ? ?You were seen and examined today by Dr. Delton Coombes. ? ?He reviewed the results of your lab work.  It showed a mutation that causes your bone marrow to make an excess of white blood cells.  All the other lab work is normal.  We will just continue to monitor this by way of lab work for now. ? ?Return as scheduled in 4 months.  ? ? ? ? ?Thank you for choosing [place] at [facility] to provide your oncology and hematology care.  To afford each patient quality time with our provider, please arrive at least 15 minutes before your scheduled appointment time.  ? ?If you have a lab appointment with the [facility] please come in thru the Main Entrance and check in at the main information desk. ? ?You need to re-schedule your appointment should you arrive 10 or more minutes late.  We strive to give you quality time with our providers, and arriving late affects you and other patients whose appointments are after yours.  Also, if you no show three or more times for appointments you may be dismissed from the clinic at the providers discretion.     ?Again, thank you for choosing [facility].  Our hope is that these requests will decrease the amount of time that you wait before being seen by our physicians.       ?_____________________________________________________________ ? ?Should you have questions after your visit to [facility], please contact our office at [phone] and follow the prompts.  Our office hours are 8:00 a.m. and 4:30 p.m. Monday - Friday.  Please note that voicemails left after 4:00 p.m. may not be returned until the following business day.  We are closed weekends and major holidays.  You do have access to a nurse 24-7, just call the main number to the clinic [phone] and do not press any options, hold on the line and a nurse will answer the phone.   ? ?For prescription  refill requests, have your pharmacy contact our office and allow 72 hours.   ? ?Due to Covid, you will need to wear a mask upon entering the hospital. If you do not have a mask, a mask will be given to you at the Main Entrance upon arrival. For doctor visits, patients may have 1 support person age 14 or older with them. For treatment visits, patients can not have anyone with them due to social distancing guidelines and our immunocompromised population.  ? ?   ?

## 2021-04-09 ENCOUNTER — Ambulatory Visit (INDEPENDENT_AMBULATORY_CARE_PROVIDER_SITE_OTHER): Payer: Medicare Other

## 2021-04-09 DIAGNOSIS — I442 Atrioventricular block, complete: Secondary | ICD-10-CM | POA: Diagnosis not present

## 2021-04-09 LAB — CUP PACEART REMOTE DEVICE CHECK
Battery Remaining Longevity: 113 mo
Battery Remaining Percentage: 88 %
Battery Voltage: 3.02 V
Brady Statistic AP VP Percent: 13 %
Brady Statistic AP VS Percent: 1.3 %
Brady Statistic AS VP Percent: 69 %
Brady Statistic AS VS Percent: 16 %
Brady Statistic RA Percent Paced: 15 %
Brady Statistic RV Percent Paced: 82 %
Date Time Interrogation Session: 20230320023839
Implantable Lead Implant Date: 20210920
Implantable Lead Implant Date: 20210920
Implantable Lead Location: 753859
Implantable Lead Location: 753860
Implantable Pulse Generator Implant Date: 20210920
Lead Channel Impedance Value: 460 Ohm
Lead Channel Impedance Value: 730 Ohm
Lead Channel Pacing Threshold Amplitude: 0.375 V
Lead Channel Pacing Threshold Amplitude: 0.75 V
Lead Channel Pacing Threshold Pulse Width: 0.5 ms
Lead Channel Pacing Threshold Pulse Width: 0.5 ms
Lead Channel Sensing Intrinsic Amplitude: 12 mV
Lead Channel Sensing Intrinsic Amplitude: 4.9 mV
Lead Channel Setting Pacing Amplitude: 0.625
Lead Channel Setting Pacing Amplitude: 1.75 V
Lead Channel Setting Pacing Pulse Width: 0.5 ms
Lead Channel Setting Sensing Sensitivity: 2 mV
Pulse Gen Model: 2272
Pulse Gen Serial Number: 3860652

## 2021-04-17 NOTE — Progress Notes (Signed)
Remote pacemaker transmission.   

## 2021-06-11 ENCOUNTER — Emergency Department (HOSPITAL_COMMUNITY): Payer: Medicare Other

## 2021-06-11 ENCOUNTER — Encounter (HOSPITAL_COMMUNITY): Payer: Self-pay

## 2021-06-11 ENCOUNTER — Inpatient Hospital Stay (HOSPITAL_COMMUNITY)
Admission: EM | Admit: 2021-06-11 | Discharge: 2021-06-14 | DRG: 327 | Disposition: A | Discharge status: Home Health | Payer: Medicare Other | Attending: Family Medicine | Admitting: Family Medicine

## 2021-06-11 ENCOUNTER — Other Ambulatory Visit: Payer: Self-pay

## 2021-06-11 DIAGNOSIS — R479 Unspecified speech disturbances: Secondary | ICD-10-CM

## 2021-06-11 DIAGNOSIS — R4701 Aphasia: Secondary | ICD-10-CM | POA: Diagnosis present

## 2021-06-11 DIAGNOSIS — Z5320 Procedure and treatment not carried out because of patient's decision for unspecified reasons: Secondary | ICD-10-CM | POA: Diagnosis present

## 2021-06-11 DIAGNOSIS — K264 Chronic or unspecified duodenal ulcer with hemorrhage: Secondary | ICD-10-CM | POA: Diagnosis not present

## 2021-06-11 DIAGNOSIS — M109 Gout, unspecified: Secondary | ICD-10-CM | POA: Diagnosis present

## 2021-06-11 DIAGNOSIS — I10 Essential (primary) hypertension: Secondary | ICD-10-CM | POA: Diagnosis present

## 2021-06-11 DIAGNOSIS — I4892 Unspecified atrial flutter: Secondary | ICD-10-CM | POA: Diagnosis present

## 2021-06-11 DIAGNOSIS — R260 Ataxic gait: Secondary | ICD-10-CM | POA: Diagnosis present

## 2021-06-11 DIAGNOSIS — D649 Anemia, unspecified: Secondary | ICD-10-CM | POA: Diagnosis not present

## 2021-06-11 DIAGNOSIS — R918 Other nonspecific abnormal finding of lung field: Secondary | ICD-10-CM

## 2021-06-11 DIAGNOSIS — E785 Hyperlipidemia, unspecified: Secondary | ICD-10-CM | POA: Diagnosis present

## 2021-06-11 DIAGNOSIS — Z961 Presence of intraocular lens: Secondary | ICD-10-CM | POA: Diagnosis present

## 2021-06-11 DIAGNOSIS — E119 Type 2 diabetes mellitus without complications: Secondary | ICD-10-CM | POA: Diagnosis present

## 2021-06-11 DIAGNOSIS — Z9841 Cataract extraction status, right eye: Secondary | ICD-10-CM

## 2021-06-11 DIAGNOSIS — D471 Chronic myeloproliferative disease: Secondary | ICD-10-CM | POA: Diagnosis present

## 2021-06-11 DIAGNOSIS — K922 Gastrointestinal hemorrhage, unspecified: Secondary | ICD-10-CM

## 2021-06-11 DIAGNOSIS — Z515 Encounter for palliative care: Secondary | ICD-10-CM

## 2021-06-11 DIAGNOSIS — Z950 Presence of cardiac pacemaker: Secondary | ICD-10-CM

## 2021-06-11 DIAGNOSIS — K921 Melena: Secondary | ICD-10-CM | POA: Diagnosis present

## 2021-06-11 DIAGNOSIS — Z800 Family history of malignant neoplasm of digestive organs: Secondary | ICD-10-CM

## 2021-06-11 DIAGNOSIS — I441 Atrioventricular block, second degree: Secondary | ICD-10-CM | POA: Diagnosis present

## 2021-06-11 DIAGNOSIS — K8020 Calculus of gallbladder without cholecystitis without obstruction: Secondary | ICD-10-CM | POA: Diagnosis present

## 2021-06-11 DIAGNOSIS — Z6823 Body mass index (BMI) 23.0-23.9, adult: Secondary | ICD-10-CM

## 2021-06-11 DIAGNOSIS — K644 Residual hemorrhoidal skin tags: Secondary | ICD-10-CM | POA: Diagnosis present

## 2021-06-11 DIAGNOSIS — Z9842 Cataract extraction status, left eye: Secondary | ICD-10-CM

## 2021-06-11 DIAGNOSIS — K5730 Diverticulosis of large intestine without perforation or abscess without bleeding: Secondary | ICD-10-CM | POA: Diagnosis present

## 2021-06-11 DIAGNOSIS — Z7901 Long term (current) use of anticoagulants: Secondary | ICD-10-CM

## 2021-06-11 DIAGNOSIS — K2970 Gastritis, unspecified, without bleeding: Secondary | ICD-10-CM | POA: Diagnosis present

## 2021-06-11 DIAGNOSIS — D500 Iron deficiency anemia secondary to blood loss (chronic): Secondary | ICD-10-CM | POA: Diagnosis present

## 2021-06-11 DIAGNOSIS — Z79899 Other long term (current) drug therapy: Secondary | ICD-10-CM

## 2021-06-11 DIAGNOSIS — J45909 Unspecified asthma, uncomplicated: Secondary | ICD-10-CM | POA: Diagnosis present

## 2021-06-11 DIAGNOSIS — R630 Anorexia: Secondary | ICD-10-CM | POA: Diagnosis present

## 2021-06-11 DIAGNOSIS — Z7984 Long term (current) use of oral hypoglycemic drugs: Secondary | ICD-10-CM

## 2021-06-11 DIAGNOSIS — R195 Other fecal abnormalities: Secondary | ICD-10-CM

## 2021-06-11 DIAGNOSIS — K2980 Duodenitis without bleeding: Secondary | ICD-10-CM | POA: Diagnosis present

## 2021-06-11 DIAGNOSIS — I4891 Unspecified atrial fibrillation: Secondary | ICD-10-CM | POA: Diagnosis present

## 2021-06-11 DIAGNOSIS — G4700 Insomnia, unspecified: Secondary | ICD-10-CM | POA: Diagnosis present

## 2021-06-11 DIAGNOSIS — Z20822 Contact with and (suspected) exposure to covid-19: Secondary | ICD-10-CM | POA: Diagnosis present

## 2021-06-11 DIAGNOSIS — R471 Dysarthria and anarthria: Secondary | ICD-10-CM

## 2021-06-11 DIAGNOSIS — R627 Adult failure to thrive: Secondary | ICD-10-CM | POA: Diagnosis present

## 2021-06-11 DIAGNOSIS — K449 Diaphragmatic hernia without obstruction or gangrene: Secondary | ICD-10-CM | POA: Diagnosis present

## 2021-06-11 DIAGNOSIS — Z66 Do not resuscitate: Secondary | ICD-10-CM | POA: Diagnosis present

## 2021-06-11 DIAGNOSIS — Z87891 Personal history of nicotine dependence: Secondary | ICD-10-CM

## 2021-06-11 DIAGNOSIS — K222 Esophageal obstruction: Secondary | ICD-10-CM | POA: Diagnosis present

## 2021-06-11 DIAGNOSIS — C3490 Malignant neoplasm of unspecified part of unspecified bronchus or lung: Secondary | ICD-10-CM | POA: Diagnosis present

## 2021-06-11 DIAGNOSIS — Z8673 Personal history of transient ischemic attack (TIA), and cerebral infarction without residual deficits: Secondary | ICD-10-CM

## 2021-06-11 DIAGNOSIS — R599 Enlarged lymph nodes, unspecified: Secondary | ICD-10-CM | POA: Diagnosis present

## 2021-06-11 HISTORY — DX: Cerebral infarction, unspecified: I63.9

## 2021-06-11 LAB — RAPID URINE DRUG SCREEN, HOSP PERFORMED
Amphetamines: NOT DETECTED
Barbiturates: NOT DETECTED
Benzodiazepines: NOT DETECTED
Cocaine: NOT DETECTED
Opiates: NOT DETECTED
Tetrahydrocannabinol: NOT DETECTED

## 2021-06-11 LAB — COMPREHENSIVE METABOLIC PANEL
ALT: 28 U/L (ref 0–44)
AST: 39 U/L (ref 15–41)
Albumin: 3.4 g/dL — ABNORMAL LOW (ref 3.5–5.0)
Alkaline Phosphatase: 142 U/L — ABNORMAL HIGH (ref 38–126)
Anion gap: 5 (ref 5–15)
BUN: 27 mg/dL — ABNORMAL HIGH (ref 8–23)
CO2: 26 mmol/L (ref 22–32)
Calcium: 8.9 mg/dL (ref 8.9–10.3)
Chloride: 105 mmol/L (ref 98–111)
Creatinine, Ser: 1.26 mg/dL — ABNORMAL HIGH (ref 0.61–1.24)
GFR, Estimated: 54 mL/min — ABNORMAL LOW (ref >60)
Glucose, Bld: 141 mg/dL — ABNORMAL HIGH (ref 70–99)
Potassium: 4.3 mmol/L (ref 3.5–5.1)
Sodium: 136 mmol/L (ref 135–145)
Total Bilirubin: 0.5 mg/dL (ref 0.3–1.2)
Total Protein: 6.8 g/dL (ref 6.5–8.1)

## 2021-06-11 LAB — CBC WITH DIFFERENTIAL/PLATELET
Abs Immature Granulocytes: 0.61 10*3/uL — ABNORMAL HIGH (ref 0.00–0.07)
Basophils Absolute: 0.2 10*3/uL — ABNORMAL HIGH (ref 0.0–0.1)
Basophils Relative: 1 %
Eosinophils Absolute: 0.1 10*3/uL (ref 0.0–0.5)
Eosinophils Relative: 0 %
HCT: 34 % — ABNORMAL LOW (ref 39.0–52.0)
Hemoglobin: 10.7 g/dL — ABNORMAL LOW (ref 13.0–17.0)
Immature Granulocytes: 2 %
Lymphocytes Relative: 7 %
Lymphs Abs: 2.4 10*3/uL (ref 0.7–4.0)
MCH: 30 pg (ref 26.0–34.0)
MCHC: 31.5 g/dL (ref 30.0–36.0)
MCV: 95.2 fL (ref 80.0–100.0)
Monocytes Absolute: 1.8 10*3/uL — ABNORMAL HIGH (ref 0.1–1.0)
Monocytes Relative: 5 %
Neutro Abs: 29.3 10*3/uL — ABNORMAL HIGH (ref 1.7–7.7)
Neutrophils Relative %: 85 %
Platelets: 454 10*3/uL — ABNORMAL HIGH (ref 150–400)
RBC: 3.57 MIL/uL — ABNORMAL LOW (ref 4.22–5.81)
RDW: 14.4 % (ref 11.5–15.5)
WBC: 34.5 10*3/uL — ABNORMAL HIGH (ref 4.0–10.5)
nRBC: 0 % (ref 0.0–0.2)

## 2021-06-11 LAB — CBC
HCT: 32.3 % — ABNORMAL LOW (ref 39.0–52.0)
Hemoglobin: 10 g/dL — ABNORMAL LOW (ref 13.0–17.0)
MCH: 29.5 pg (ref 26.0–34.0)
MCHC: 31 g/dL (ref 30.0–36.0)
MCV: 95.3 fL (ref 80.0–100.0)
Platelets: 376 10*3/uL (ref 150–400)
RBC: 3.39 MIL/uL — ABNORMAL LOW (ref 4.22–5.81)
RDW: 14.4 % (ref 11.5–15.5)
WBC: 33.6 10*3/uL — ABNORMAL HIGH (ref 4.0–10.5)
nRBC: 0 % (ref 0.0–0.2)

## 2021-06-11 LAB — URINALYSIS, ROUTINE W REFLEX MICROSCOPIC
Bacteria, UA: NONE SEEN
Bilirubin Urine: NEGATIVE
Glucose, UA: NEGATIVE mg/dL
Hgb urine dipstick: NEGATIVE
Ketones, ur: NEGATIVE mg/dL
Nitrite: NEGATIVE
Protein, ur: NEGATIVE mg/dL
Specific Gravity, Urine: 1.009 (ref 1.005–1.030)
pH: 6 (ref 5.0–8.0)

## 2021-06-11 LAB — RESP PANEL BY RT-PCR (FLU A&B, COVID) ARPGX2
Influenza A by PCR: NEGATIVE
Influenza B by PCR: NEGATIVE
SARS Coronavirus 2 by RT PCR: NEGATIVE

## 2021-06-11 LAB — ABO/RH: ABO/RH(D): O NEG

## 2021-06-11 LAB — TROPONIN I (HIGH SENSITIVITY)
Troponin I (High Sensitivity): 11 ng/L (ref <18)
Troponin I (High Sensitivity): 11 ng/L (ref <18)

## 2021-06-11 LAB — MAGNESIUM: Magnesium: 2.1 mg/dL (ref 1.7–2.4)

## 2021-06-11 LAB — PROTIME-INR
INR: 1.8 — ABNORMAL HIGH (ref 0.8–1.2)
Prothrombin Time: 21 seconds — ABNORMAL HIGH (ref 11.4–15.2)

## 2021-06-11 LAB — APTT: aPTT: 38 seconds — ABNORMAL HIGH (ref 24–36)

## 2021-06-11 LAB — ETHANOL: Alcohol, Ethyl (B): <10 mg/dL (ref <10)

## 2021-06-11 LAB — PREPARE RBC (CROSSMATCH)

## 2021-06-11 LAB — LACTIC ACID, PLASMA
Lactic Acid, Venous: 1.9 mmol/L (ref 0.5–1.9)
Lactic Acid, Venous: 2.3 mmol/L (ref 0.5–1.9)
Lactic Acid, Venous: 1 mmol/L (ref 0.5–1.9)

## 2021-06-11 LAB — GLUCOSE, CAPILLARY: Glucose-Capillary: 108 mg/dL — ABNORMAL HIGH (ref 70–99)

## 2021-06-11 LAB — CBG MONITORING, ED: Glucose-Capillary: 141 mg/dL — ABNORMAL HIGH (ref 70–99)

## 2021-06-11 MED ORDER — ALLOPURINOL 100 MG PO TABS
300.0000 mg | ORAL_TABLET | Freq: Every day | ORAL | Status: DC
  Administered 2021-06-11 – 2021-06-14 (×3): 300 mg via ORAL
  Filled 2021-06-11 (×4): qty 3

## 2021-06-11 MED ORDER — ACETAMINOPHEN 160 MG/5ML PO SOLN: 650.0000 mg | ORAL | Status: DC | PRN

## 2021-06-11 MED ORDER — INSULIN ASPART 100 UNIT/ML IJ SOLN: 0.0000 [IU] | Freq: Three times a day (TID) | INTRAMUSCULAR | Status: DC

## 2021-06-11 MED ORDER — LACTATED RINGERS IV BOLUS
500.0000 mL | Freq: Once | INTRAVENOUS | Status: AC
  Administered 2021-06-11: 500 mL via INTRAVENOUS

## 2021-06-11 MED ORDER — SODIUM CHLORIDE 0.9 % IV SOLN: 10.0000 mL/h | Freq: Once | INTRAVENOUS | Status: DC

## 2021-06-11 MED ORDER — IOHEXOL 350 MG/ML SOLN
80.0000 mL | Freq: Once | INTRAVENOUS | Status: AC | PRN
  Administered 2021-06-11: 80 mL via INTRAVENOUS

## 2021-06-11 MED ORDER — ATORVASTATIN CALCIUM 10 MG PO TABS
10.0000 mg | ORAL_TABLET | Freq: Every day | ORAL | Status: DC
  Administered 2021-06-11 – 2021-06-13 (×3): 10 mg via ORAL
  Filled 2021-06-11 (×3): qty 1

## 2021-06-11 MED ORDER — ACETAMINOPHEN 325 MG PO TABS: 650.0000 mg | ORAL_TABLET | ORAL | Status: DC | PRN

## 2021-06-11 MED ORDER — ACETAMINOPHEN 650 MG RE SUPP: 650.0000 mg | RECTAL | Status: DC | PRN

## 2021-06-11 MED ORDER — LACTATED RINGERS IV SOLN: INTRAVENOUS | Status: DC

## 2021-06-11 MED ORDER — PANTOPRAZOLE SODIUM 40 MG IV SOLR
40.0000 mg | Freq: Two times a day (BID) | INTRAVENOUS | Status: DC
  Administered 2021-06-11 – 2021-06-14 (×5): 40 mg via INTRAVENOUS
  Filled 2021-06-11 (×5): qty 10

## 2021-06-11 NOTE — ED Notes (Signed)
Got pt off of bedside commode and back in bed

## 2021-06-11 NOTE — Assessment & Plan Note (Addendum)
Already followed by oncology for his myeloproliferative disorder.  Oncology consult tomorrow  Palliative care also consulted, discussed with daughter  FTT picture with fatigue, weight loss/poor PO intake. Nutrition consulted as well as PT/OT  Gentle, time limited IVF

## 2021-06-11 NOTE — Assessment & Plan Note (Signed)
Continue allopurinol. Hold colchicine with GI bleed. Appears he is taking this daily.

## 2021-06-11 NOTE — Assessment & Plan Note (Addendum)
88 year old male presenting with 2 week history of fatigue, weakness, poor PO intake found to have 5g drop in hemoglobin over last 3 months with positive fecal occult -observation to telemetry -no active signs of bleeding. BUN mildly elevated at 27 -GI consulted -clear liquid diet and NPO at midnight -PPI BID  -serial CBC -transfused 1 unit PRBC in ED -hold xarelto  -has been taking colchicine daily, holding this. Denies any other NSAIDs -last cscope in 2015, no polyps -hx of colonic adenomas and colon cancer in his mother

## 2021-06-11 NOTE — Assessment & Plan Note (Addendum)
Resume metformin

## 2021-06-11 NOTE — ED Notes (Signed)
[employer] [facility] called and reported no arrhythmias on interrogation. Will fax reports shortly

## 2021-06-11 NOTE — ED Notes (Signed)
Date and time results received: 06/11/21 3:25 PM   Test: Lactic Acid Critical Value: 2.3  Name of Provider Notified: Dr. Doren Custard   Orders Received? Or Actions Taken?: See Orders

## 2021-06-11 NOTE — Assessment & Plan Note (Addendum)
Followed by Dr. Quentin Ore He had a pacemaker implanted October 11, 2019. AF also has been detected on device interrogation  Switching from Xarelto to Eliquis per neurology recommendations

## 2021-06-11 NOTE — Plan of Care (Signed)
Contacted by Dr. Doren Custard [facility] about this patient with hx prior stroke who presented today with 3 day hx dysarthria and abnormal gait. Unable to get MRI at [facility] 2/2 PM. Recommend ED to ED transfer for MRI brain. If (+) for acute ischemic stroke, please consult neurology at that point.  Su Monks, MD [facility] [phone]  If 7pm- 7am, please page neurology on call as listed in [place].

## 2021-06-11 NOTE — Assessment & Plan Note (Signed)
Noted on PPM interrogation in 01/2020 and started on xarelto given CHA2DS2-VAsc of 4 Holding in setting of GI bleed, status post EGD colonoscopy, Switching Xarelto to Eliquis to be started in 1 week from now per GI recommendation

## 2021-06-11 NOTE — Assessment & Plan Note (Signed)
Followed by oncology. H&H dropped, WBC elevated.  Continue to follow cbc

## 2021-06-11 NOTE — ED Triage Notes (Signed)
Patient via EMS with complaints of slurred speech and abnormal ambulation that started 3 days prior.

## 2021-06-11 NOTE — Assessment & Plan Note (Signed)
In setting of likely malignancy Palliative care consult Nutrition consult, check albumin PT/OT

## 2021-06-11 NOTE — ED Provider Notes (Signed)
[facility] EMERGENCY DEPARTMENT Provider Note   CSN: [id number] Arrival date & time: 06/11/21  1113     History  Chief Complaint  Patient presents with   Aphasia    Ronnie Lawrence is a 88 y.o. male.  HPI Patient presenting for generalized weakness.  Report that EMS got from family was that he has had slurred speech and gait ataxia for the past 3 days.  Patient, himself, reports 1 month of ongoing symptoms of poor appetite, poor p.o. intake, GI upset, and generalized weakness.  When asked about his difficulty walking, he states that he simply gets very fatigued with even minor exertion.  Although he has felt nausea, he has not had any vomiting.  He does feel that his speech is currently slurred.  The slurred speech has been present for the past 3 days.  He states that he has had a history of stroke last year.  At the time of his stroke, he presented with word finding difficulty.  Since his stroke, he has had persistent word finding difficulty at times.  His other medical history includes asthma, DM, gout, HTN, and heart block s/p pacemaker placement.  He is on Xarelto, which she states was initiated at the time of his pacemaker placement. He is not aware of any other recent blood loss.    Home Medications Prior to Admission medications   Medication Sig Start Date End Date Taking? Authorizing Provider  allopurinol (ZYLOPRIM) 300 MG tablet Take 300 mg by mouth daily. 03/21/21  Yes [provider]  atorvastatin (LIPITOR) 10 MG tablet Take 10 mg by mouth at bedtime.   Yes [provider]  colchicine 0.6 MG tablet Take 2 tablets (1.2 mg) by mouth when you pick up your prescription.  2 hours after your first dose take one tablet.  12 hours later take one tablet.  Call your PCP for further medication/instructions. Patient taking differently: Take 0.6 mg by mouth daily. 04/19/20  Yes Vickie Epley, MD  magnesium gluconate (MAGONATE) 500 MG tablet Take 500 mg by mouth daily.    Yes [provider]  metFORMIN (GLUCOPHAGE-XR) 500 MG 24 hr tablet Take 500 mg by mouth 2 (two) times daily.   Yes [provider]  rivaroxaban (XARELTO) 20 MG TABS tablet Take 1 tablet (20 mg total) by mouth daily with supper. 04/12/20  Yes Vickie Epley, MD      Allergies    Patient has no known allergies.    Review of Systems   Review of Systems  Constitutional:  Positive for appetite change, fatigue and unexpected weight change.  Neurological:  Positive for speech difficulty and weakness (Generalized).  Hematological:  Bruises/bleeds easily (On Xarelto).  All other systems reviewed and are negative.  Physical Exam Updated Vital Signs BP 125/60   Pulse 96   Temp 97.6 F (36.4 C) (Oral)   Resp 16   Ht 5\' 10"  (1.778 m)   Wt 73.5 kg   SpO2 96%   BMI 23.24 kg/m  Physical Exam Vitals and nursing note reviewed.  Constitutional:      General: He is not in acute distress.    Appearance: Normal appearance. He is well-developed and normal weight. He is not ill-appearing, toxic-appearing or diaphoretic.  HENT:     Head: Normocephalic and atraumatic.     Right Ear: External ear normal.     Left Ear: External ear normal.     Nose: Nose normal.     Mouth/Throat:  Mouth: Mucous membranes are moist.     Pharynx: Oropharynx is clear.  Eyes:     Extraocular Movements: Extraocular movements intact.     Conjunctiva/sclera: Conjunctivae normal.  Cardiovascular:     Rate and Rhythm: Normal rate and regular rhythm.     Heart sounds: No murmur heard. Pulmonary:     Effort: Pulmonary effort is normal. No respiratory distress.     Breath sounds: Normal breath sounds. No wheezing or rales.  Abdominal:     Palpations: Abdomen is soft.     Tenderness: There is no abdominal tenderness.  Musculoskeletal:        General: No swelling. Normal range of motion.     Cervical back: Normal range of motion and neck supple. No rigidity.     Right lower leg: No edema.      Left lower leg: No edema.  Skin:    General: Skin is warm and dry.     Coloration: Skin is not jaundiced or pale.  Neurological:     General: No focal deficit present.     Mental Status: He is alert and oriented to person, place, and time.     Cranial Nerves: No cranial nerve deficit or facial asymmetry. Dysarthria: Difficult to appreciate slurred speech but patient and daughter's feel that it is present.    Sensory: Sensation is intact. No sensory deficit.     Motor: No weakness, abnormal muscle tone or pronator drift.     Coordination: Coordination is intact. Coordination normal. Finger-Nose-Finger Test normal.     Gait: Gait abnormal (Unsteady on feet).  Psychiatric:        Mood and Affect: Mood normal.        Behavior: Behavior normal.        Thought Content: Thought content normal.        Judgment: Judgment normal.    ED Results / Procedures / Treatments   Labs (all labs ordered are listed, but only abnormal results are displayed) Labs Reviewed  PROTIME-INR - Abnormal; Notable for the following components:      Result Value   Prothrombin Time 21.0 (*)    INR 1.8 (*)    All other components within normal limits  APTT - Abnormal; Notable for the following components:   aPTT 38 (*)    All other components within normal limits  COMPREHENSIVE METABOLIC PANEL - Abnormal; Notable for the following components:   Glucose, Bld 141 (*)    BUN 27 (*)    Creatinine, Ser 1.26 (*)    Albumin 3.4 (*)    Alkaline Phosphatase 142 (*)    GFR, Estimated 54 (*)    All other components within normal limits  URINALYSIS, ROUTINE W REFLEX MICROSCOPIC - Abnormal; Notable for the following components:   Color, Urine STRAW (*)    Leukocytes,Ua TRACE (*)    All other components within normal limits  CBC WITH DIFFERENTIAL/PLATELET - Abnormal; Notable for the following components:   WBC 34.5 (*)    RBC 3.57 (*)    Hemoglobin 10.7 (*)    HCT 34.0 (*)    Platelets 454 (*)    Neutro Abs 29.3 (*)     Monocytes Absolute 1.8 (*)    Basophils Absolute 0.2 (*)    Abs Immature Granulocytes 0.61 (*)    All other components within normal limits  LACTIC ACID, PLASMA - Abnormal; Notable for the following components:   Lactic Acid, Venous 2.3 (*)    All other components  within normal limits  CBG MONITORING, ED - Abnormal; Notable for the following components:   Glucose-Capillary 141 (*)    All other components within normal limits  POC OCCULT BLOOD, ED - Abnormal  RESP PANEL BY RT-PCR (FLU A&B, COVID) [id number]  CULTURE, BLOOD (ROUTINE X 2)  CULTURE, BLOOD (ROUTINE X 2)  ETHANOL  RAPID URINE DRUG SCREEN, HOSP PERFORMED  MAGNESIUM  LACTIC ACID, PLASMA  TYPE AND SCREEN  PREPARE RBC (CROSSMATCH)  TROPONIN I (HIGH SENSITIVITY)  TROPONIN I (HIGH SENSITIVITY)    EKG EKG Interpretation  Date/Time:  Monday Jun 11 2021 11:31:18 EDT Ventricular Rate:  78 PR Interval:  251 QRS Duration: 143 QT Interval:  432 QTC Calculation: 493 R Axis:   252 Text Interpretation: Sinus rhythm Atrial premature complexes Prolonged PR interval Nonspecific IVCD with LAD Anterolateral infarct, old Confirmed by Godfrey Pick [id number]) on 06/11/2021 11:55:39 AM  Radiology DG Chest 1 View  Result Date: 06/11/2021 CLINICAL DATA:  Generalized weakness, slurred speech EXAM: CHEST  1 VIEW COMPARISON:  10/11/2019 FINDINGS: Cardiac size is within normal limits. There are no signs of pulmonary edema. There is 3.8 cm homogeneous nodule with slightly spiculated margins in the left parahilar region. Low position of diaphragms may suggest COPD. Linear densities in the left lower lung fields have not changed suggesting scarring. Pacemaker battery is seen in the left infraclavicular region. IMPRESSION: There is 3.8 cm homogeneous opacity in the left parahilar region suggesting possible malignant neoplasm. Less likely possibility would be pneumonia. Follow-up CT chest may be considered. Electronically Signed   By: Elmer Picker M.D.    On: 06/11/2021 12:05   CT HEAD WO CONTRAST  Result Date: 06/11/2021 CLINICAL DATA:  Neuro deficit, acute, stroke suspected EXAM: CT HEAD WITHOUT CONTRAST TECHNIQUE: Contiguous axial images were obtained from the base of the skull through the vertex without intravenous contrast. RADIATION DOSE REDUCTION: This exam was performed according to the departmental dose-optimization program which includes automated exposure control, adjustment of the mA and/or kV according to patient size and/or use of iterative reconstruction technique. COMPARISON:  CT head November 03, 2020. FINDINGS: Brain: Increased size/conspicuity of and area of hypodensity in the right parietal lobe. No acute hemorrhage, mass lesion, midline shift, hydrocephalus. Cerebral atrophy. Chronic microvascular disease. Vascular: No hyperdense vessel identified. Skull: No acute fracture. Sinuses/Orbits: Left maxillary sinus mucosal thickening. No acute orbital findings. Other: No mastoid effusions. IMPRESSION: Increased size/conspicuity of an area of hypodensity in the right parietal lobe, potentially evolution of previously seen infarct or acute on chronic infarct. If there is concern for acute infarct, recommend MRI. Electronically Signed   By: Margaretha Sheffield M.D.   On: 06/11/2021 12:00   CT Angio Chest PE W and/or Wo Contrast  Result Date: 06/11/2021 CLINICAL DATA:  Abnormal chest radiographs, high clinical suspicion for pulmonary embolism EXAM: CT ANGIOGRAPHY CHEST WITH CONTRAST TECHNIQUE: Multidetector CT imaging of the chest was performed using the standard protocol during bolus administration of intravenous contrast. Multiplanar CT image reconstructions and MIPs were obtained to evaluate the vascular anatomy. RADIATION DOSE REDUCTION: This exam was performed according to the departmental dose-optimization program which includes automated exposure control, adjustment of the mA and/or kV according to patient size and/or use of iterative  reconstruction technique. CONTRAST:  80mL OMNIPAQUE IOHEXOL 350 MG/ML SOLN COMPARISON:  Previous studies including the chest radiographs done earlier today FINDINGS: Cardiovascular: Heart is enlarged in size. Pacemaker leads are noted in place. Coronary artery calcifications are seen. There is homogeneous enhancement in thoracic  aorta. There is ectasia of ascending thoracic aorta measuring 3.8 cm. There are calcifications and atherosclerotic plaques in the thoracic aorta. There are no intraluminal filling defects in the pulmonary artery branches. Mediastinum/Nodes: There are few slightly enlarged lymph nodes in mediastinum and hilar regions largest measuring 1.5 x 1 cm in the aortopulmonary window. Lungs/Pleura: There is 4.7 x 3.8 cm noncalcified mass in the anterior left mid lung fields. Margins are slightly spiculated. This lesion is extending to the pleura with pleural thickening. There is narrowing of traversing pulmonary vascular branches within the lesion. Centrilobular emphysema is seen. Increased interstitial markings are seen in the periphery of both lungs, more so in the lower lung fields. There are multiple blebs and bullae in the periphery of both lungs. There is no pleural effusion or pneumothorax. Upper Abdomen: There is fatty infiltration in the liver. Small calcific density in the gallbladder suggests gallbladder stones. There is moderate sized hiatal hernia. There is 2.8 cm nodule in the left adrenal. Density measurements are less than 10 Hounsfield units suggesting possible adenoma. Musculoskeletal: Degenerative changes are noted in the thoracic spine and visualized lower cervical spine. There is low attenuation with coarse trabeculae in the body of L1 vertebra. This finding is not optimally evaluated. This may suggest benign process such as hemangioma or metastatic disease. Review of the MIP images confirms the above findings. IMPRESSION: There is 4.7 x 3.8 cm mass lesion with spiculated margins  in the anterior left mid lung fields in the left upper lobe. The lesion is extending to the pleura with focal pleural thickening. This lesion has to be considered a malignant neoplasm until proven otherwise. Follow-up PET-CT and biopsy should be considered. There are slightly enlarged lymph nodes in the mediastinum largest measuring 1.5 x 1 cm in the aortopulmonary window. This may suggest incidental reactive hyperplasia of nodes or metastatic disease. There is low-attenuation in the body of L1 vertebra with coarse trabeculae suggesting possible hemangioma. Less likely possibility would be skeletal metastatic disease. This finding could also be evaluated in the PET-CT. There is 2.8 cm nodule in the left adrenal, possibly adenoma. This finding could also be evaluated in the PET-CT. 0 Fatty liver. Gallbladder stones. There is moderate sized hiatal hernia. Coronary artery calcifications are seen. Other findings as described in the body of the report. Electronically Signed   By: Elmer Picker M.D.   On: 06/11/2021 13:47    Procedures Procedures    Medications Ordered in ED Medications  0.9 %  sodium chloride infusion (has no administration in time range)  pantoprazole (PROTONIX) injection 40 mg (40 mg Intravenous Given 06/11/21 1635)  lactated ringers bolus 500 mL (500 mLs Intravenous New Bag/Given 06/11/21 1209)  lactated ringers bolus 500 mL (500 mLs Intravenous New Bag/Given 06/11/21 1225)  iohexol (OMNIPAQUE) 350 MG/ML injection 80 mL (80 mLs Intravenous Contrast Given 06/11/21 1314)  lactated ringers bolus 500 mL (500 mLs Intravenous New Bag/Given 06/11/21 1635)    ED Course/ Medical Decision Making/ A&P                           Medical Decision Making Amount and/or Complexity of Data Reviewed Labs: ordered. Radiology: ordered.  Risk Prescription drug management.   This patient presents to the ED for concern of slurred speech, generalized weakness, and exertional fatigue, this involves  an extensive number of treatment options, and is a complaint that carries with it a high risk of complications and morbidity.  The  differential diagnosis includes dehydration, CVA, TIA, neoplasm, infection, metabolic derangements, polypharmacy, anemia  Co morbidities that complicate the patient evaluation  asthma, DM, gout, HTN, and heart block s/p pacemaker placement   Additional history obtained:  Additional history obtained from patient's daughters External records from outside source obtained and reviewed including EMR   Lab Tests:  I Ordered, and personally interpreted labs.  The pertinent results include: Increasing leukocytosis, 30% drop in hemoglobin over the past 3 months, slight increase in creatinine from baseline, although initial lactic acid was normal, repeat was mildly elevated   Imaging Studies ordered:  I ordered imaging studies including chest x-ray, CT head, CTA chest I independently visualized and interpreted imaging which showed CT head showed increased size of hypodensity in right parietal lobe, possibly evolution of previous stroke versus new infarct; chest x-ray and CTA chest show left-sided lung mass, concerning for malignant neoplasm.  There is also associated with focal pleural thickening and enlarged mediastinal lymph nodes. I agree with the radiologist interpretation   Cardiac Monitoring: / EKG:  The patient was maintained on a cardiac monitor.  I personally viewed and interpreted the cardiac monitored which showed an underlying rhythm of: Sinus rhythm   Consultations Obtained:  I requested consultation with the gastroenterologist,  and discussed lab and imaging findings as well as pertinent plan - they recommend: N.p.o. after midnight   Problem List / ED Course / Critical interventions / Medication management  Patient is a 88 year old male presenting for 1 month of worsening generalized weakness, exertional fatigue, and poor p.o. intake in addition  to 3 days of slurred speech.  He does have a recent history of stroke last year.  At that time, he had word finding difficulty and this has persisted since.  He is currently on Xarelto.  On arrival, patient is alert and oriented.  Vital signs are notable for widened pulse pressure.  He has no focal neurologic deficits.  Although patient and daughters feel that his speech is slurred, this is difficult for me to appreciate.  He denies any current areas of discomfort.  Diagnostic work-up was initiated.  On his work-up, there were multiple findings: Chest x-ray showed concern of left-sided lung neoplasm; CT of head showed increased size of left parietal lobe hypodensity indicative of evolution of previous stroke versus new infarct; lab work showed an acute drop in hemoglobin and a increasing leukocytosis; patient is a slight increase in creatinine from baseline.  His initial lactic acid was normal, although repeat was slightly elevated.  CTA of chest showed further characterization of left-sided lung mass.  There was localized pleural thickening and mediastinal lymph node enlargement as well.  In regards to the CT head findings, I spoke with neurologist on-call, Dr. Quinn Axe.  He recommends MRI.  Given that patient has a pacemaker, this would need to be performed at [facility].  On reassessment, patient denies any new or worsening symptoms.  I informed him and his daughters of all of the results of diagnostic work-up.  In regards to his lung mass, patient is currently followed by Dr. Raliegh Ip with oncology at [facility].  He will need further work-up for this.  In regards to the drop in his hemoglobin, I feel that this may be a primary cause of his worsening generalized weakness and exertional fatigue.  He is not aware of any blood loss.  On DRE, patient does have melanotic stool.  Presence of blood was confirmed with Hemoccult testing.  Patient was consented for PRBC  transfusion for symptomatic anemia.  He was agreeable to  admission to hospital here at [facility].  He was advised that MRI to further characterize CT findings would be unavailable here at [facility], given his pacemaker.  Patient expressed understanding.  Patient declined admission to [facility] for MRI testing.  I spoke with gastroenterologist on-call, who recommends n.p.o. after midnight for possible endoscopy.  Protonix was ordered.  Patient was admitted to hospitalist for further management. I ordered medication including IV fluids for poor p.o. intake; Protonix for presumed upper GI bleed; PRBCs for symptomatic anemia Reevaluation of the patient after these medicines showed that the patient improved I have reviewed the patients home medicines and have made adjustments as needed   Social Determinants of Health:  Has family support at home and access to outpatient care, including oncologist, Dr. Raliegh Ip  CRITICAL CARE Performed by: Godfrey Pick   Total critical care time: 35 minutes  Critical care time was exclusive of separately billable procedures and treating other patients.  Critical care was necessary to treat or prevent imminent or life-threatening deterioration.  Critical care was time spent personally by me on the following activities: development of treatment plan with patient and/or surrogate as well as nursing, discussions with consultants, evaluation of patient's response to treatment, examination of patient, obtaining history from patient or surrogate, ordering and performing treatments and interventions, ordering and review of laboratory studies, ordering and review of radiographic studies, pulse oximetry and re-evaluation of patient's condition.         Final Clinical Impression(s) / ED Diagnoses Final diagnoses:  Symptomatic anemia  Melena  Mass of left lung  Difficulty with speech    Rx / DC Orders ED Discharge Orders     None         Godfrey Pick, MD 06/11/21 1718

## 2021-06-11 NOTE — ED Notes (Signed)
POC occult blood sample positive

## 2021-06-11 NOTE — Assessment & Plan Note (Signed)
Has one week history of progressive dysarthria (slurred speech), although clear on exam today  Recommended admit to [facility] for brain MRI (has PPM); however, he declined to be transferred and declined MRI. I again discussed with him again and his daughter and he would like to stay here -Neurochecks per protocol -echo reviewed negative for any acute deformity ejection fraction preserved -holding xarelto with GI bleed >>> changed to Eliquis in 5 days from now -Status post speech evaluation, tolerating p.o. -PT/ OT/ SLP -recommended home health arranged

## 2021-06-11 NOTE — H&P (Signed)
History and Physical    Patient: Ronnie Lawrence:[id number] DOB: [birth year] DOA: 06/11/2021 DOS: the patient was seen and examined on 06/11/2021 PCP: [name], MD  Patient coming from: Home - lives alone. Uses cane at times.    Chief Complaint: fatigue/weakness and slurred speech   HPI: Ronnie Lawrence is a 88 y.o. male with medical history significant of T2DM, HTN, hx of CVA, gout, atrial flutter on xarelto who presented to Ed for feeling tired and weak for a couple weeks. He has had poor PO intake for 2+ weeks and has had an upset stomach.  He states his speech started to slur about one week ago, but has gotten progressive worse and he may have had some trouble with his right leg. He denies any other focal deficits, confusion or weakness. He didn't want  to come to ER and family firmly suggested he come to ED today as they were concerned for stroke.   He denies having dark, tarry stools or bloody stools. He denies any abdominal pian. Has had diarrhea x 2 weeks and has lost quite a bit of weight in the past few weeks. He has no appetite. Denies any N/V or NSAID use.   Last cscope in chart in 07/2013 in epic, can not see report.   He does not smoke, past history. He drinks occasional alcohol.   ER Course:  vitals: afebrile, bp: 130/67, HR; 78, RR: 26, oxygen: 96%RA Pertinent labs: INR 1.8, BUN: 27, creatinine: 1.26, WBC; 34.5, hgb: 10.7, lactic acid: 1.9>2.3, fecal occult: POSITIVE Ct head: increased size/conspicuity of an area of hypodensity in the right parietal lobe, potentially evolution of previously seen infarct or acute on chronic infarct.  CXR: There is 3.8 cm homogeneous opacity in the left parahilar region suggesting possible malignant neoplasm. Less likely possibility would be pneumonia. Follow-up CT chest may be considered.  CTA chest: There is 4.7 x 3.8 cm mass lesion with spiculated margins in the anterior left mid lung fields in the left upper lobe. The lesion  is extending to the pleura with focal pleural thickening. This lesion has to be considered a malignant neoplasm until proven otherwise. Follow-up PET-CT and biopsy should be considered.slightly enlarged lymph nodes in the mediastinum largest measuring 1.5x1cm in aortapulmonary window. May suggest incidental reactive hyperplasia of nodes or metastatic disease.   There is low-attenuation in the body of L1 vertebra with coarse trabeculae suggesting possible hemangioma. Less likely possibility would be skeletal metastatic disease. This finding could also be evaluated in the PET-CT.  2.8cm nodule in left adrenal, possibly adenoma. Moderate hiatal hernia. Gallstones.   In ED: GI and neurology consulted. Neurology recommended transfer to [facility] for MRI for possible new CVA; however, patient declined to go to [facility] and didn't want MRI. Given 1L LR bolus, protonix BID. Wrote for 1 unit PRBC.     Review of Systems: As mentioned in the history of present illness. All other systems reviewed and are negative. Past Medical History:  Diagnosis Date   Asthma    Diabetes mellitus    Gout    Hypertension    Stroke [facility])    Wears dentures    partial upper   Past Surgical History:  Procedure Laterality Date   BACK SURGERY     CATARACT EXTRACTION W/PHACO Left 08/31/2019   Procedure: CATARACT EXTRACTION PHACO AND INTRAOCULAR LENS PLACEMENT (IOC) LEFT DIABETIC 9.58 00:51.0;  Surgeon: Birder Robson, MD;  Location: [place];  Service: Ophthalmology;  Laterality: Left;  Diabetic - oral meds   CATARACT EXTRACTION W/PHACO Right 09/21/2019   Procedure: CATARACT EXTRACTION PHACO AND INTRAOCULAR LENS PLACEMENT (IOC) RIGHT DIABETIC 13.56  01:10.7;  Surgeon: Birder Robson, MD;  Location: [place];  Service: Ophthalmology;  Laterality: Right;   COLONOSCOPY N/A 08/13/2013   Procedure: COLONOSCOPY;  Surgeon: Rogene Houston, MD;  [facility];  Service: Endoscopy;  Laterality: N/A;   [place]     bilateral   PACEMAKER IMPLANT N/A 10/11/2019   Procedure: PACEMAKER IMPLANT;  Surgeon: Vickie Epley, MD;  Location: [place] [facility];  Service: Cardiovascular;  Laterality: N/A;   Social History:  reports that he quit smoking about 42 years ago. His smoking use included cigarettes. He has never used smokeless tobacco. He reports current alcohol use of about 4.0 standard drinks per week. He reports that he does not use drugs.  No Known Allergies  No family history on file.  Prior to Admission medications   Medication Sig Start Date End Date Taking? Authorizing Provider  allopurinol (ZYLOPRIM) 300 MG tablet Take 300 mg by mouth daily. 03/21/21  Yes [provider]  atorvastatin (LIPITOR) 10 MG tablet Take 10 mg by mouth at bedtime.   Yes [provider]  colchicine 0.6 MG tablet Take 2 tablets (1.2 mg) by mouth when you pick up your prescription.  2 hours after your first dose take one tablet.  12 hours later take one tablet.  Call your PCP for further medication/instructions. Patient taking differently: Take 0.6 mg by mouth daily. 04/19/20  Yes Vickie Epley, MD  magnesium gluconate (MAGONATE) 500 MG tablet Take 500 mg by mouth daily.   Yes [provider]  metFORMIN (GLUCOPHAGE-XR) 500 MG 24 hr tablet Take 500 mg by mouth 2 (two) times daily.   Yes [provider]  rivaroxaban (XARELTO) 20 MG TABS tablet Take 1 tablet (20 mg total) by mouth daily with supper. 04/12/20  Yes Vickie Epley, MD    Physical Exam: Vitals:   06/11/21 1747 06/11/21 1801 06/11/21 1855 06/11/21 1959  BP: 140/62 (!) 131/53 129/74 (!) 115/55  Pulse: 83 85 76 79  Resp: 20 19 18 18   Temp: 97.7 F (36.5 C) 97.8 F (36.6 C) 97.8 F (36.6 C) 98 F (36.7 C)  TempSrc: Oral Oral Oral   SpO2: 98% 98% 99% 97%  Weight:      Height:       General:  Appears calm and comfortable and is in NAD Eyes:  PERRL, EOMI, normal lids, iris ENT:  HOH, lips  & tongue, mmm; appropriate dentition Neck:  no LAD, masses or thyromegaly; no carotid bruits Cardiovascular:  RRR, no m/r/g. No LE edema.  Respiratory:   CTA bilaterally with no wheezes/rales/rhonchi.  Normal respiratory effort. Abdomen:  soft, NT, ND, NABS Back:   normal alignment, no CVAT Skin:  no rash or induration seen on limited exam Musculoskeletal:  grossly normal tone BUE/BLE, good ROM, no bony abnormality Lower extremity:  No LE edema.  Limited foot exam with no ulcerations.  2+ distal pulses. Psychiatric:  grossly normal mood and affect, speech fluent and appropriate, AOx3 Neurologic:  CN 2-12 grossly intact, moves all extremities in coordinated fashion, sensation intact, negative pronator drift.    Radiological Exams on Admission: Independently reviewed - see discussion in A/P where applicable  DG Chest 1 View  Result Date: 06/11/2021 CLINICAL DATA:  Generalized weakness, slurred speech EXAM: CHEST  1 VIEW COMPARISON:  10/11/2019 FINDINGS: Cardiac size is within normal limits. There are no signs of pulmonary edema. There is 3.8 cm homogeneous nodule with slightly spiculated margins in the left parahilar region. Low position of diaphragms may suggest COPD. Linear densities in the left lower lung fields have not changed suggesting scarring. Pacemaker battery is seen in the left infraclavicular region. IMPRESSION: There is 3.8 cm homogeneous opacity in the left parahilar region suggesting possible malignant neoplasm. Less likely possibility would be pneumonia. Follow-up CT chest may be considered. Electronically Signed   By: Elmer Picker M.D.   On: 06/11/2021 12:05   CT HEAD WO CONTRAST  Result Date: 06/11/2021 CLINICAL DATA:  Neuro deficit, acute, stroke suspected EXAM: CT HEAD WITHOUT CONTRAST TECHNIQUE: Contiguous axial images were obtained from the base of the skull through the vertex without intravenous contrast. RADIATION DOSE REDUCTION: This exam was performed according  to the departmental dose-optimization program which includes automated exposure control, adjustment of the mA and/or kV according to patient size and/or use of iterative reconstruction technique. COMPARISON:  CT head November 03, 2020. FINDINGS: Brain: Increased size/conspicuity of and area of hypodensity in the right parietal lobe. No acute hemorrhage, mass lesion, midline shift, hydrocephalus. Cerebral atrophy. Chronic microvascular disease. Vascular: No hyperdense vessel identified. Skull: No acute fracture. Sinuses/Orbits: Left maxillary sinus mucosal thickening. No acute orbital findings. Other: No mastoid effusions. IMPRESSION: Increased size/conspicuity of an area of hypodensity in the right parietal lobe, potentially evolution of previously seen infarct or acute on chronic infarct. If there is concern for acute infarct, recommend MRI. Electronically Signed   By: Margaretha Sheffield M.D.   On: 06/11/2021 12:00   CT Angio Chest PE W and/or Wo Contrast  Result Date: 06/11/2021 CLINICAL DATA:  Abnormal chest radiographs, high clinical suspicion for pulmonary embolism EXAM: CT ANGIOGRAPHY CHEST WITH CONTRAST TECHNIQUE: Multidetector CT imaging of the chest was performed using the standard protocol during bolus administration of intravenous contrast. Multiplanar CT image reconstructions and MIPs were obtained to evaluate the vascular anatomy. RADIATION DOSE REDUCTION: This exam was performed according to the departmental dose-optimization program which includes automated exposure control, adjustment of the mA and/or kV according to patient size and/or use of iterative reconstruction technique. CONTRAST:  32mL OMNIPAQUE IOHEXOL 350 MG/ML SOLN COMPARISON:  Previous studies including the chest radiographs done earlier today FINDINGS: Cardiovascular: Heart is enlarged in size. Pacemaker leads are noted in place. Coronary artery calcifications are seen. There is homogeneous enhancement in thoracic aorta. There is  ectasia of ascending thoracic aorta measuring 3.8 cm. There are calcifications and atherosclerotic plaques in the thoracic aorta. There are no intraluminal filling defects in the pulmonary artery branches. Mediastinum/Nodes: There are few slightly enlarged lymph nodes in mediastinum and hilar regions largest measuring 1.5 x 1 cm in the aortopulmonary window. Lungs/Pleura: There is 4.7 x 3.8 cm noncalcified mass in the anterior left mid lung fields. Margins are slightly spiculated. This lesion is extending to the pleura with pleural thickening. There is narrowing of traversing pulmonary vascular branches within the lesion. Centrilobular emphysema is seen. Increased interstitial markings are seen in the periphery of both lungs, more so in the lower lung fields. There are multiple blebs and bullae in the periphery of both lungs. There is no pleural effusion or pneumothorax. Upper Abdomen: There is fatty infiltration in the liver. Small calcific density in the gallbladder suggests gallbladder stones. There is moderate sized hiatal hernia. There is 2.8 cm nodule in the left adrenal. Density measurements are less than 10 Hounsfield  units suggesting possible adenoma. Musculoskeletal: Degenerative changes are noted in the thoracic spine and visualized lower cervical spine. There is low attenuation with coarse trabeculae in the body of L1 vertebra. This finding is not optimally evaluated. This may suggest benign process such as hemangioma or metastatic disease. Review of the MIP images confirms the above findings. IMPRESSION: There is 4.7 x 3.8 cm mass lesion with spiculated margins in the anterior left mid lung fields in the left upper lobe. The lesion is extending to the pleura with focal pleural thickening. This lesion has to be considered a malignant neoplasm until proven otherwise. Follow-up PET-CT and biopsy should be considered. There are slightly enlarged lymph nodes in the mediastinum largest measuring 1.5 x 1 cm in  the aortopulmonary window. This may suggest incidental reactive hyperplasia of nodes or metastatic disease. There is low-attenuation in the body of L1 vertebra with coarse trabeculae suggesting possible hemangioma. Less likely possibility would be skeletal metastatic disease. This finding could also be evaluated in the PET-CT. There is 2.8 cm nodule in the left adrenal, possibly adenoma. This finding could also be evaluated in the PET-CT. 0 Fatty liver. Gallbladder stones. There is moderate sized hiatal hernia. Coronary artery calcifications are seen. Other findings as described in the body of the report. Electronically Signed   By: Elmer Picker M.D.   On: 06/11/2021 13:47    EKG: Independently reviewed.  NSR with rate 78, PAC; nonspecific ST changes with no evidence of acute ischemia   Labs on Admission: I have personally reviewed the available labs and imaging studies at the time of the admission.  Pertinent labs:   INR 1.8,  BUN: 27,  creatinine: 1.26,  WBC; 34.5,  hgb: 10.7,  lactic acid: 1.9>2.3,  fecal occult: POSITIVE  Assessment and Plan: Principal Problem:   Symptomatic anemia with GI bleed  Active Problems:   new dysarthria with history of past CVA    Lung mass considered malignant until proven otherwise   Controlled type 2 diabetes mellitus without complication, without long-term current use of insulin (HCC)   Failure to thrive in adult   Atrioventricular block, Mobitz type 1, Wenckebach s/p PPM    Myeloproliferative disorder (HCC) CALR positive   Atrial flutter (HCC)   Gout   History of CVA (cerebrovascular accident)   GI bleed    Assessment and Plan: * Symptomatic anemia with GI bleed  88 year old male presenting with 2 week history of fatigue, weakness, poor PO intake found to have 5g drop in hemoglobin over last 3 months with positive fecal occult -observation to telemetry -no active signs of bleeding. BUN mildly elevated at 27 -GI consulted -clear liquid  diet and NPO at midnight -PPI BID  -serial CBC -transfused 1 unit PRBC in ED -hold xarelto  -has been taking colchicine daily, holding this. Denies any other NSAIDs -last cscope in 2015, no polyps -hx of colonic adenomas and colon cancer in his mother   new dysarthria with history of past CVA  Has one week history of progressive dysarthria (slurred speech), although clear on exam today  Recommended admit to [facility] for brain MRI (has PPM); however, he declined to be transferred and declined MRI. I again discussed with him again and his daughter and he would like to stay here -Neurochecks per protocol -echo ordered  -holding xarelto with GI bleed  -swallow screen  -PT/ OT/ SLP consult   Lung mass considered malignant until proven otherwise Already followed by oncology for his myeloproliferative disorder.  Oncology consult tomorrow  Palliative care also consulted, discussed with daughter  FTT picture with fatigue, weight loss/poor PO intake. Nutrition consulted as well as PT/OT  Gentle, time limited IVF   Failure to thrive in adult In setting of likely malignancy Palliative care consult Nutrition consult, check albumin PT/OT  Controlled type 2 diabetes mellitus without complication, without long-term current use of insulin (HCC) A1C pending. Hold metformin  SSI and accuchecks per protocol   Atrioventricular block, Mobitz type 1, Wenckebach s/p PPM  Followed by Dr. Quentin Ore He had a pacemaker implanted October 11, 2019. AF also has been detected on device interrogation   Atrial flutter ([place]) Noted on PPM interrogation in 01/2020 and started on xarelto given CHA2DS2-VAsc of 4 Holding in setting of GI bleed NSR, continue telemetry    Myeloproliferative disorder ([place]) CALR positive Followed by oncology. H&H dropped, WBC elevated.  Continue to follow cbc  Gout Continue allopurinol. Hold colchicine with GI bleed. Appears he is taking this daily.     Advance Care Planning:    Code Status: Full Code   Consults: GI and neurology   DVT Prophylaxis: SCDs   Family Communication: updated daughter, Lindaann Pascal,  by phone [phone]  Severity of Illness: The appropriate patient status for this patient is OBSERVATION. Observation status is judged to be reasonable and necessary in order to provide the required intensity of service to ensure the patient's safety. The patient's presenting symptoms, physical exam findings, and initial radiographic and laboratory data in the context of their medical condition is felt to place them at decreased risk for further clinical deterioration. Furthermore, it is anticipated that the patient will be medically stable for discharge from the hospital within 2 midnights of admission.   Author: Orma Flaming, MD 06/11/2021 10:20 PM  For on call review [email or web address].

## 2021-06-12 ENCOUNTER — Encounter (HOSPITAL_COMMUNITY): Payer: Self-pay | Admitting: Family Medicine

## 2021-06-12 ENCOUNTER — Observation Stay (HOSPITAL_COMMUNITY): Payer: Medicare Other

## 2021-06-12 ENCOUNTER — Other Ambulatory Visit (HOSPITAL_COMMUNITY): Payer: Medicare Other

## 2021-06-12 DIAGNOSIS — I4892 Unspecified atrial flutter: Secondary | ICD-10-CM | POA: Diagnosis not present

## 2021-06-12 DIAGNOSIS — R918 Other nonspecific abnormal finding of lung field: Secondary | ICD-10-CM

## 2021-06-12 DIAGNOSIS — R195 Other fecal abnormalities: Secondary | ICD-10-CM | POA: Diagnosis not present

## 2021-06-12 DIAGNOSIS — E119 Type 2 diabetes mellitus without complications: Secondary | ICD-10-CM

## 2021-06-12 DIAGNOSIS — Z7189 Other specified counseling: Secondary | ICD-10-CM | POA: Diagnosis not present

## 2021-06-12 DIAGNOSIS — Z8673 Personal history of transient ischemic attack (TIA), and cerebral infarction without residual deficits: Secondary | ICD-10-CM

## 2021-06-12 DIAGNOSIS — D649 Anemia, unspecified: Secondary | ICD-10-CM | POA: Diagnosis not present

## 2021-06-12 DIAGNOSIS — R479 Unspecified speech disturbances: Secondary | ICD-10-CM | POA: Diagnosis not present

## 2021-06-12 DIAGNOSIS — R627 Adult failure to thrive: Secondary | ICD-10-CM | POA: Diagnosis not present

## 2021-06-12 DIAGNOSIS — K922 Gastrointestinal hemorrhage, unspecified: Secondary | ICD-10-CM

## 2021-06-12 DIAGNOSIS — R471 Dysarthria and anarthria: Secondary | ICD-10-CM | POA: Diagnosis not present

## 2021-06-12 LAB — CBC
HCT: 31.5 % — ABNORMAL LOW (ref 39.0–52.0)
HCT: 34.8 % — ABNORMAL LOW (ref 39.0–52.0)
Hemoglobin: 9.7 g/dL — ABNORMAL LOW (ref 13.0–17.0)
Hemoglobin: 11.1 g/dL — ABNORMAL LOW (ref 13.0–17.0)
MCH: 29.2 pg (ref 26.0–34.0)
MCH: 30.2 pg (ref 26.0–34.0)
MCHC: 30.8 g/dL (ref 30.0–36.0)
MCHC: 31.9 g/dL (ref 30.0–36.0)
MCV: 94.9 fL (ref 80.0–100.0)
MCV: 94.8 fL (ref 80.0–100.0)
Platelets: 365 10*3/uL (ref 150–400)
Platelets: 427 10*3/uL — ABNORMAL HIGH (ref 150–400)
RBC: 3.32 MIL/uL — ABNORMAL LOW (ref 4.22–5.81)
RBC: 3.67 MIL/uL — ABNORMAL LOW (ref 4.22–5.81)
RDW: 14.4 % (ref 11.5–15.5)
RDW: 14.5 % (ref 11.5–15.5)
WBC: 31.4 10*3/uL — ABNORMAL HIGH (ref 4.0–10.5)
WBC: 33.9 10*3/uL — ABNORMAL HIGH (ref 4.0–10.5)
nRBC: 0 % (ref 0.0–0.2)
nRBC: 0 % (ref 0.0–0.2)

## 2021-06-12 LAB — GLUCOSE, CAPILLARY
Glucose-Capillary: 112 mg/dL — ABNORMAL HIGH (ref 70–99)
Glucose-Capillary: 115 mg/dL — ABNORMAL HIGH (ref 70–99)
Glucose-Capillary: 119 mg/dL — ABNORMAL HIGH (ref 70–99)

## 2021-06-12 LAB — FOLATE: Folate: 10.6 ng/mL (ref >5.9)

## 2021-06-12 LAB — TYPE AND SCREEN
ABO/RH(D): O NEG
Antibody Screen: NEGATIVE
Unit division: 0

## 2021-06-12 LAB — BASIC METABOLIC PANEL
Anion gap: 5 (ref 5–15)
BUN: 19 mg/dL (ref 8–23)
CO2: 25 mmol/L (ref 22–32)
Calcium: 8.4 mg/dL — ABNORMAL LOW (ref 8.9–10.3)
Chloride: 108 mmol/L (ref 98–111)
Creatinine, Ser: 1.05 mg/dL (ref 0.61–1.24)
GFR, Estimated: >60 mL/min (ref >60)
Glucose, Bld: 100 mg/dL — ABNORMAL HIGH (ref 70–99)
Potassium: 4.2 mmol/L (ref 3.5–5.1)
Sodium: 138 mmol/L (ref 135–145)

## 2021-06-12 LAB — LIPID PANEL
Cholesterol: 67 mg/dL (ref 0–200)
HDL: 25 mg/dL — ABNORMAL LOW (ref >40)
LDL Cholesterol: 32 mg/dL (ref 0–99)
Total CHOL/HDL Ratio: 2.7 RATIO
Triglycerides: 49 mg/dL (ref <150)
VLDL: 10 mg/dL (ref 0–40)

## 2021-06-12 LAB — BPAM RBC
Blood Product Expiration Date: 202306232359
ISSUE DATE / TIME: 202305221729
Unit Type and Rh: 9500

## 2021-06-12 LAB — HEMOGLOBIN A1C
Hgb A1c MFr Bld: 6.3 % — ABNORMAL HIGH (ref 4.8–5.6)
Mean Plasma Glucose: 134.11 mg/dL

## 2021-06-12 LAB — LACTIC ACID, PLASMA: Lactic Acid, Venous: 0.9 mmol/L (ref 0.5–1.9)

## 2021-06-12 LAB — IRON AND TIBC
Iron: 29 ug/dL — ABNORMAL LOW (ref 45–182)
Saturation Ratios: 13 % — ABNORMAL LOW (ref 17.9–39.5)
TIBC: 227 ug/dL — ABNORMAL LOW (ref 250–450)
UIBC: 198 ug/dL

## 2021-06-12 LAB — VITAMIN B12: Vitamin B-12: 496 pg/mL (ref 180–914)

## 2021-06-12 LAB — FERRITIN: Ferritin: 151 ng/mL (ref 24–336)

## 2021-06-12 MED ORDER — ENSURE ENLIVE PO LIQD
237.0000 mL | Freq: Two times a day (BID) | ORAL | Status: DC
  Administered 2021-06-14: 237 mL via ORAL

## 2021-06-12 MED ORDER — IOHEXOL 350 MG/ML SOLN
75.0000 mL | Freq: Once | INTRAVENOUS | Status: AC | PRN
  Administered 2021-06-12: 75 mL via INTRAVENOUS

## 2021-06-12 MED ORDER — PEG 3350-KCL-NA BICARB-NACL 420 G PO SOLR
4000.0000 mL | Freq: Once | ORAL | Status: AC
  Administered 2021-06-12: 4000 mL via ORAL

## 2021-06-12 MED ORDER — MIRTAZAPINE 15 MG PO TABS
7.5000 mg | ORAL_TABLET | Freq: Every day | ORAL | Status: DC
  Administered 2021-06-12 – 2021-06-13 (×2): 7.5 mg via ORAL
  Filled 2021-06-12 (×2): qty 1

## 2021-06-12 MED ORDER — ADULT MULTIVITAMIN W/MINERALS CH
1.0000 | ORAL_TABLET | Freq: Every day | ORAL | Status: DC
  Administered 2021-06-12 – 2021-06-14 (×2): 1 via ORAL
  Filled 2021-06-12 (×3): qty 1

## 2021-06-12 NOTE — Care Management Obs Status (Signed)
[place] NOTIFICATION   Patient Details  Name: Ronnie Lawrence MRN: [id number] Date of Birth: September 10, 1927   Medicare Observation Status Notification Given:  Yes    Tommy Medal 06/12/2021, 9:02 PM

## 2021-06-12 NOTE — Plan of Care (Signed)
  Problem: Acute Rehab PT Goals(only PT should resolve) Goal: Pt Will Go Supine/Side To Sit Outcome: Progressing Flowsheets (Taken 06/12/2021 1031) Pt will go Supine/Side to Sit:  Independently  with modified independence Goal: Patient Will Transfer Sit To/From Stand Outcome: Progressing Flowsheets (Taken 06/12/2021 1031) Patient will transfer sit to/from stand:  Independently  with modified independence Goal: Pt Will Transfer Bed To Chair/Chair To Bed Outcome: Progressing Flowsheets (Taken 06/12/2021 1031) Pt will Transfer Bed to Chair/Chair to Bed:  Independently  with modified independence Goal: Pt Will Perform Standing Balance Or Pre-Gait Outcome: Progressing Flowsheets (Taken 06/12/2021 1031) Pt will perform standing balance or pre-gait:  Independently  with Modified Independent  1-2 min Goal: Pt Will Ambulate Outcome: Progressing Flowsheets (Taken 06/12/2021 1031) Pt will Ambulate:  100 feet  with modified independence  with supervision   10:32 AM, 06/12/21 Lestine Box, S/PT

## 2021-06-12 NOTE — Plan of Care (Signed)
CT head repeat and CTA head and neck reviewed. Right parietal hypodensity on previous CT and repeat CT showed no significant change and more likely chronic in nature. CTA head and neck no LVO or significant stenosis. No evidence of obvious brain enhancement to suggest brain mets. Pt no focal deficit and currently primary team is working on the lung mass lesion and anemia from GIB. Currently Xarelto on hold given symptomatic anemia. Recommend to consider switch to Eliquis once anemia stabilized, as eliquis did better than Xarelto on GIB in clinical trials. Continue low dose statin. Palliative care on board. Neurology will sign off for now. Please call with questions. Thanks for the consult.  Rosalin Hawking, MD PhD Stroke Neurology 06/12/2021 7:56 PM

## 2021-06-12 NOTE — Consult Note (Signed)
Consultation Note Date: 06/12/2021   Patient Name: Ronnie Lawrence  DOB: 1927-03-03  MRN: [id number]  Age / Sex: 88 y.o., male  PCP: [name], MD Referring Physician: Roxan Hockey, MD  Reason for Consultation: Goals of care  HPI/Patient Profile: 88 y.o. male  with past medical history of newly diagnosed myeloproliferative neoplasm with leukocytosis under observation, stroke last year with word finding deficits, asthma, DM, gout, hypertension, heart block status post pacemaker placement, he is on Xarelto for his pacemaker, admitted on 06/11/2021 with complaints of generalized weakness, slurred speech, difficulties ambulating for the past 3 days.  CT showed right parietal hypodensity that is increased in size since his last CT scan in March 2022 an MRI was recommended but he refused.  Chest x-ray and CTA of chest showed left upper lobe mass concerning for malignancy.  CTA of the brain is pending to rule out possible metastasis from lung malignancy.  He had drop in hemoglobin requiring 1 unit of PRBCs-GI has been consulted and recommended an EGD which patient initially refused but now wants to proceed with.  Palliative medicine consulted for goals of care.  Primary Decision Maker PATIENT- with support of two daughters- Ronnie Lawrence and Ronnie Lawrence  Discussion: I have reviewed medical records including Care Everywhere, progress notes from this and prior admissions, labs and imaging, discussed with RN.  On evaluation patient is awake alert, he is oriented he is able to participate in goals of care discussion.  At the bedside with patient and his 2 daughters Ronnie Lawrence and Ronnie Lawrence present.  I introduced Palliative Medicine as specialized medical care for people living with serious illness. It focuses on providing relief from the symptoms and stress of a serious illness. The goal is to improve quality of life for both the patient  and the family.  We discussed a brief life review of the patient.  He has retired he worked for [employer].  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  He now finds joy in watching professional sports.  As far as functional and nutritional status -prior to this admission he was living independently, although he does admit that things are starting to get harder.  He has lost a significant amount of weight noted he was 182 pounds in September he is now down to 160 per this admission weight however he reports 150 per his scale at home.  We discussed patient's current illness and what it means in the larger context of patient's on-going co-morbidities.  Natural disease trajectory and expectations at EOL were discussed.  It was noted that during consult with GI he stated he did not want any more tests or procedures that he had stated if he was going to die he was just going to die and his daughters had stated the goals of care are comfort.  Discussed his feelings about this-he notes that at this time he has changed his mind and wants to continue to pursue procedures for diagnosis and then determine treatment plan after diagnoses are made.  Ronnie Lawrence  says that his goals of care are to continue to be independent and able to ambulate around his home.  He said he wants to "get better".  He defines "get better" as being able to walk around his home and care for himself in a way that is better than how he is now.  He shares that the news he has gotten from his medical providers was not what he expected when he came into the hospital.  He is still doing a great deal of processing.  His daughter is at bedside also acknowledged this.  We discussed if he does have cancer it would need to be diagnosed through a biopsy which can be invasive.  We also discussed that sometimes the treatment for cancers can make you feel worse than you feel now.  Encouraged him and his family to thoroughly discuss his treatment  options with his medical providers to ensure that any further procedures and treatment options are going to aid him in achieving that goal.  He does state that if that goal cannot be achieved through current medical care, then he would choose to transition to comfort.  We discussed that hospice would be appropriate and provide good support for him if this decision were made.  His daughter is at bedside also confirmed that they do not want to put him through more unnecessary procedures-they would only want medical interventions that are going to improve his functional status.    Ronnie Lawrence is currently DNR.  Therefore CODE STATUS was not discussed.  Discussed with patient/family the importance of continued conversation with family and the medical providers regarding overall plan of care and treatment options, ensuring decisions are within the context of the patient's values and GOCs.    Hospice and Palliative Care services outpatient were explained and offered. Hard choices book was provided.  Questions and concerns were addressed. The family was encouraged to call with questions or concerns.     SUMMARY OF RECOMMENDATIONS -See above discussion regarding patient's goals of care, recommend providers discuss these goals and if offered medical procedures and interventions are going to achieve these goals -Patient and family are processing current information- they are interested in further diagnostic procedures     Code Status/Advance Care Planning: DNR   Prognosis:   Unable to determine  Discharge Planning: To Be Determined  Primary Diagnoses: Present on Admission:  Symptomatic anemia with GI bleed   Myeloproliferative disorder (HCC) CALR positive  Atrioventricular block, Mobitz type 1, Wenckebach s/p PPM    Review of Systems  Constitutional:  Positive for appetite change, fatigue and unexpected weight change.  Respiratory:  Positive for shortness of breath.    Physical Exam  Vital  Signs: BP (!) 114/51   Pulse 73   Temp 98.8 F (37.1 C) (Oral)   Resp 20   Ht 5\' 10"  (1.778 m)   Wt 73.5 kg   SpO2 97%   BMI 23.24 kg/m  Pain Scale: 0-10   Pain Score: 0-No pain   SpO2: SpO2: 97 % O2 Device:SpO2: 97 % O2 Flow Rate: .   IO: Intake/output summary:  Intake/Output Summary (Last 24 hours) at 06/12/2021 1507 Last data filed at 06/12/2021 1257 Gross per 24 hour  Intake 616 ml  Output --  Net 616 ml    LBM: Last BM Date : 06/11/21 Baseline Weight: Weight: 73.5 kg Most recent weight: Weight: 73.5 kg       Thank you for this consult. Palliative medicine will continue to follow  and assist as needed.    Greater than 50%  of this time was spent counseling and coordinating care related to the above assessment and plan.  Signed by: Mariana Kaufman, AGNP-C Palliative Medicine    Please contact Palliative Medicine Team phone at [phone] for questions and concerns.  For individual provider: See Shea Evans

## 2021-06-12 NOTE — Progress Notes (Addendum)
Initial Nutrition Assessment  DOCUMENTATION CODES:      INTERVENTION:  Regular diet   Ensure Plus BID   Multivitamin daily   NUTRITION DIAGNOSIS:   Inadequate oral intake related to  (unknown etiology) as evidenced by  (unplanned weight loss trend, hx of stroke and recent complaint of weakness, slurred speech).   GOAL:  Patient will meet greater than or equal to 90% of their needs   MONITOR:  PO intake, Supplement acceptance, Labs, Weight trends  REASON FOR ASSESSMENT:   Consult Assessment of nutrition requirement/status, Poor PO  ASSESSMENT: Patient is a 88 yo male with history of DM, HTN, GOUT, Stroke who presents with complaint of weakness, slurred speech and symptomatic anemia.   Swallow screen completed. Clear liquid diet at breakfast. Fully advanced to regular meal completion 50% breakfast and 20% lunch. During RD visit patient is being taken for CT.   Weight-Encounters- downward trend 10/11/20- 82.6 kg - current 73.5 kg (11%) 9.1 kg the past 8 months.  Labs reviewed. A1C- 6.3% , Hgb 11.1   Medications:  insulin, protonix, lipitor.  Labs:  NUTRITION - FOCUSED PHYSICAL EXAM:  Deferred to follow up. Patient is leaving for test.  He required assistance with standing.  Diet Order:   Diet Order             Diet regular Room service appropriate? Yes; Fluid consistency: Thin  Diet effective now                   EDUCATION NEEDS:  Not appropriate for education at this time  Skin:  Skin Assessment: Reviewed RN Assessment  Last BM:  prior to admission  Height:   Ht Readings from Last 1 Encounters:  06/11/21 5\' 10"  (1.778 m)    Weight:   Wt Readings from Last 1 Encounters:  06/11/21 73.5 kg    Ideal Body Weight:   75 kg  BMI:  Body mass index is 23.24 kg/m.  Estimated Nutritional Needs:   Kcal:  1900-2100  Protein:  89-103 gr  Fluid:  2 liters daily   Colman Cater MS,RD,CSG,LDN Contact: Shea Evans

## 2021-06-12 NOTE — Consult Note (Signed)
[facility] Telemedicine Consult   Requesting Provider: Dr. Anastasia Pall  Chief Complaint:  Generalized weakness for 2 weeks, slurry speech for a week  HPI:  88 year old male with history of hypertension, hyperlipidemia, diabetes, a flutter/A-fib on Xarelto, complete heart block status post pacemaker, stroke admitted for generalized weakness, decreased p.o. intake and diarrhea for 2 weeks, slurred speech for 1 week.  Per patient for the last 2 weeks patient feels tired, whole body weakness with diarrhea, no appetite to eat, felt stomach full all day long.  Family also found him to have slurred speech for the last week, however improving over time not back to baseline yet.  There is a questionable right leg weakness but patient denies any unilateral weakness.  He denies any facial weakness, headache, arm or leg weakness, LOC.  CT showed right parietal hypodensity increased size then last CT in 10/2020.  Recommend MRI.  However patient refused MRI at this time.  He stated that he had stroke in 10/2020 difficulty walking and difficulty with his checkbook.  He had a CT scan at that time showed right parietal small hypodensity concerning for acute/subacute infarct.  No MRI was done at the time.  He was told to have a stroke later on by his PCP.  He does not know the etiology of his stroke.  He had a history of heart block status post pacemaker follows with Dr. Quentin Ore at cardiology.  He was found to have a flutter/A-fib on pacemaker and started on Xarelto.  Patient stated that he is compliant with Xarelto.  Current pacemaker interrogation showed no arrhythmia.  On this admission, patient chest x-ray concerning for malignancy.  CTA chest showed left upper lobe mass lesion.  Hemoglobin dropped from baseline 15-10, fecal occult blood positive.  He received 1 PRBC transfusion last night.  This morning hemoglobin 11.1.  LKW: 2 weeks ago tpa given?: No, outside window IR Thrombectomy? No, outside  window, low NIH score, no LVO sign Modified Rankin Scale: 2-Slight disability-UNABLE to perform all activities but does not need assistance   Exam: Vitals:   06/12/21 0422 06/12/21 1109  BP: (!) 107/50 (!) 126/58  Pulse: 73 77  Resp: 18 20  Temp: 98.1 F (36.7 C) 98 F (36.7 C)  SpO2: 97% 93%     Temp:  [97.7 F (36.5 C)-98.6 F (37 C)] 98 F (36.7 C) (05/23 1109) Pulse Rate:  [67-96] 77 (05/23 1109) Resp:  [13-22] 20 (05/23 1109) BP: (100-140)/(50-79) 126/58 (05/23 1109) SpO2:  [93 %-100 %] 93 % (05/23 1109)  General - Well nourished, well developed, in no apparent distress.  Ophthalmologic - fundi not visualized due to noncooperation.  Cardiovascular - Regular rhythm and rate.  Neuro - awake, alert, eyes open, orientated to age, place, time and people. No aphasia, fluent language, slight dysarthria, following all simple commands. Able to name and repeat and read. No gaze palsy, tracking bilaterally, visual field full. No facial droop. Tongue midline.  Muscle strength symmetrical bilaterally. Sensation symmetrical bilaterally, b/l FTN intact, gait not tested.    Imaging Reviewed:  DG Chest 1 View  Result Date: 06/11/2021 CLINICAL DATA:  Generalized weakness, slurred speech EXAM: CHEST  1 VIEW COMPARISON:  10/11/2019 FINDINGS: Cardiac size is within normal limits. There are no signs of pulmonary edema. There is 3.8 cm homogeneous nodule with slightly spiculated margins in the left parahilar region. Low position of diaphragms may suggest COPD. Linear densities in the left lower lung fields have not changed suggesting scarring. Pacemaker battery  is seen in the left infraclavicular region. IMPRESSION: There is 3.8 cm homogeneous opacity in the left parahilar region suggesting possible malignant neoplasm. Less likely possibility would be pneumonia. Follow-up CT chest may be considered. Electronically Signed   By: Elmer Picker M.D.   On: 06/11/2021 12:05   CT HEAD WO  CONTRAST  Result Date: 06/11/2021 CLINICAL DATA:  Neuro deficit, acute, stroke suspected EXAM: CT HEAD WITHOUT CONTRAST TECHNIQUE: Contiguous axial images were obtained from the base of the skull through the vertex without intravenous contrast. RADIATION DOSE REDUCTION: This exam was performed according to the departmental dose-optimization program which includes automated exposure control, adjustment of the mA and/or kV according to patient size and/or use of iterative reconstruction technique. COMPARISON:  CT head November 03, 2020. FINDINGS: Brain: Increased size/conspicuity of and area of hypodensity in the right parietal lobe. No acute hemorrhage, mass lesion, midline shift, hydrocephalus. Cerebral atrophy. Chronic microvascular disease. Vascular: No hyperdense vessel identified. Skull: No acute fracture. Sinuses/Orbits: Left maxillary sinus mucosal thickening. No acute orbital findings. Other: No mastoid effusions. IMPRESSION: Increased size/conspicuity of an area of hypodensity in the right parietal lobe, potentially evolution of previously seen infarct or acute on chronic infarct. If there is concern for acute infarct, recommend MRI. Electronically Signed   By: Margaretha Sheffield M.D.   On: 06/11/2021 12:00   CT Angio Chest PE W and/or Wo Contrast  Result Date: 06/11/2021 CLINICAL DATA:  Abnormal chest radiographs, high clinical suspicion for pulmonary embolism EXAM: CT ANGIOGRAPHY CHEST WITH CONTRAST TECHNIQUE: Multidetector CT imaging of the chest was performed using the standard protocol during bolus administration of intravenous contrast. Multiplanar CT image reconstructions and MIPs were obtained to evaluate the vascular anatomy. RADIATION DOSE REDUCTION: This exam was performed according to the departmental dose-optimization program which includes automated exposure control, adjustment of the mA and/or kV according to patient size and/or use of iterative reconstruction technique. CONTRAST:  20mL  OMNIPAQUE IOHEXOL 350 MG/ML SOLN COMPARISON:  Previous studies including the chest radiographs done earlier today FINDINGS: Cardiovascular: Heart is enlarged in size. Pacemaker leads are noted in place. Coronary artery calcifications are seen. There is homogeneous enhancement in thoracic aorta. There is ectasia of ascending thoracic aorta measuring 3.8 cm. There are calcifications and atherosclerotic plaques in the thoracic aorta. There are no intraluminal filling defects in the pulmonary artery branches. Mediastinum/Nodes: There are few slightly enlarged lymph nodes in mediastinum and hilar regions largest measuring 1.5 x 1 cm in the aortopulmonary window. Lungs/Pleura: There is 4.7 x 3.8 cm noncalcified mass in the anterior left mid lung fields. Margins are slightly spiculated. This lesion is extending to the pleura with pleural thickening. There is narrowing of traversing pulmonary vascular branches within the lesion. Centrilobular emphysema is seen. Increased interstitial markings are seen in the periphery of both lungs, more so in the lower lung fields. There are multiple blebs and bullae in the periphery of both lungs. There is no pleural effusion or pneumothorax. Upper Abdomen: There is fatty infiltration in the liver. Small calcific density in the gallbladder suggests gallbladder stones. There is moderate sized hiatal hernia. There is 2.8 cm nodule in the left adrenal. Density measurements are less than 10 Hounsfield units suggesting possible adenoma. Musculoskeletal: Degenerative changes are noted in the thoracic spine and visualized lower cervical spine. There is low attenuation with coarse trabeculae in the body of L1 vertebra. This finding is not optimally evaluated. This may suggest benign process such as hemangioma or metastatic disease. Review of the MIP  images confirms the above findings. IMPRESSION: There is 4.7 x 3.8 cm mass lesion with spiculated margins in the anterior left mid lung fields in the  left upper lobe. The lesion is extending to the pleura with focal pleural thickening. This lesion has to be considered a malignant neoplasm until proven otherwise. Follow-up PET-CT and biopsy should be considered. There are slightly enlarged lymph nodes in the mediastinum largest measuring 1.5 x 1 cm in the aortopulmonary window. This may suggest incidental reactive hyperplasia of nodes or metastatic disease. There is low-attenuation in the body of L1 vertebra with coarse trabeculae suggesting possible hemangioma. Less likely possibility would be skeletal metastatic disease. This finding could also be evaluated in the PET-CT. There is 2.8 cm nodule in the left adrenal, possibly adenoma. This finding could also be evaluated in the PET-CT. 0 Fatty liver. Gallbladder stones. There is moderate sized hiatal hernia. Coronary artery calcifications are seen. Other findings as described in the body of the report. Electronically Signed   By: Elmer Picker M.D.   On: 06/11/2021 13:47     Labs reviewed in epic and pertinent values follow: LDL 32, A1c 6.3.  UDS negative.  Hemoglobin 10.7->10.0->9.7->11.1  Assessment:  88 year old male with history of hypertension, hyperlipidemia, diabetes, a flutter/A-fib on Xarelto, complete heart block status post pacemaker, stroke admitted for generalized weakness, decreased p.o. intake and diarrhea for 2 weeks, slurred speech for 1 week. He had stroke in 10/2020 and CT at that time showed right parietal small hypodensity concerning for acute/subacute infarct.  No MRI was done at the time. This time CT showed right parietal hypodensity increased size then last CT in 10/2020.  Recommend MRI.  However patient refused MRI at this time. Current pacemaker interrogation showed no arrhythmia. However, his CTA chest showed left upper lobe mass lesion.  Hemoglobin dropped from baseline 15-10, fecal occult blood positive.  He received 1 PRBC transfusion last night.  This morning hemoglobin  11.1.  Current neuro exam intact except slight dysarthria per patient, but no focal deficit.  Will recommend to do CT head and neck to further evaluate the right parietal hypodensity and to rule out brain metastasis from potential lung malignancy.  Patient currently not antithrombotic candidate given GI bleeding and acute anemia.  Once hemoglobin stable, may consider resume anticoagulation given history of A-fib/a flutter.  May consider switch Xarelto to Eliquis given GI bleeding.  Recommendations:  Will order CT head and neck to further evaluate the right parietal hypodensity and to rule out brain metastasis from potential lung malignancy.  Patient is in agreement Patient currently not antithrombotic candidate given GI bleeding and acute anemia.  Once hemoglobin stable, may consider resume anticoagulation given history of A-fib/a flutter.  May consider switch Xarelto to Eliquis given GI bleeding. Continue home lipitor 10 Nutrition supplement such as Glucerna.  Discussed with Dr. Denton Brick.    Consult Participants: Patient, RN, 2 daughters and me Location of the provider: Home Location of the patient: [facility]  This consult was provided via telemedicine with 2-way video and audio communication. The patient/family was informed that care would be provided in this way and agreed to receive care in this manner.   This patient is receiving care for possible acute neurological changes. There was 50 minutes of care by this provider at the time of service, including time for direct evaluation via telemedicine, review of medical records, imaging studies and discussion of findings with providers, the patient and/or family.  Rosalin Hawking, MD PhD Stroke Neurology 06/12/2021  11:50 AM

## 2021-06-12 NOTE — Evaluation (Signed)
Speech Language Pathology Evaluation Patient Details Name: HIROSHI KRUMMEL MRN: [id number] DOB: 1927/03/27 Today's Date: 06/12/2021 Time: 5361-4431 SLP Time Calculation (min) (ACUTE ONLY): 28 min  Problem List:  Patient Active Problem List   Diagnosis Date Noted   Symptomatic anemia with GI bleed  06/11/2021   Controlled type 2 diabetes mellitus without complication, without long-term current use of insulin ([place]) 06/11/2021   History of CVA (cerebrovascular accident) 06/11/2021   new dysarthria with history of past CVA  06/11/2021   GI bleed 06/11/2021   Lung mass considered malignant until proven otherwise 06/11/2021   Gout 06/11/2021   Atrial flutter ([place]) 06/11/2021   Failure to thrive in adult 06/11/2021   Myeloproliferative disorder ([place]) CALR positive 03/29/2021   Leukocytosis 03/01/2021   Cataract 09/08/2019   Atrioventricular block, Mobitz type 1, Wenckebach s/p PPM  09/08/2019   Past Medical History:  Past Medical History:  Diagnosis Date   Asthma    Diabetes mellitus    Gout    Hypertension    Stroke [facility])    Wears dentures    partial upper   Past Surgical History:  Past Surgical History:  Procedure Laterality Date   BACK SURGERY     CATARACT EXTRACTION W/PHACO Left 08/31/2019   Procedure: CATARACT EXTRACTION PHACO AND INTRAOCULAR LENS PLACEMENT (IOC) LEFT DIABETIC 9.58 00:51.0;  Surgeon: Birder Robson, MD;  Location: [facility];  Service: Ophthalmology;  Laterality: Left;  Diabetic - oral meds   CATARACT EXTRACTION W/PHACO Right 09/21/2019   Procedure: CATARACT EXTRACTION PHACO AND INTRAOCULAR LENS PLACEMENT (IOC) RIGHT DIABETIC 13.56  01:10.7;  Surgeon: Birder Robson, MD;  Location: [place];  Service: Ophthalmology;  Laterality: Right;   COLONOSCOPY N/A 08/13/2013   Procedure: COLONOSCOPY;  Surgeon: Rogene Houston, MD;  [facility];  Service: Endoscopy;  Laterality: N/A;  [place]     bilateral   PACEMAKER  IMPLANT N/A 10/11/2019   Procedure: PACEMAKER IMPLANT;  Surgeon: Vickie Epley, MD;  Location: [place] [facility];  Service: Cardiovascular;  Laterality: N/A;   HPI:  AURTHUR WINGERTER is a 88 y.o. male with medical history significant of T2DM, HTN, hx of CVA, gout, atrial flutter on xarelto who presented to Ed for feeling tired and weak for a couple weeks. He has had poor PO intake for 2+ weeks and has had an upset stomach.  He states his speech started to slur about one week ago, but has gotten progressive worse and he may have had some trouble with his right leg. He denies any other focal deficits, confusion or weakness. He didn't want  to come to ER and family firmly suggested he come to ED today as they were concerned for stroke.   Assessment / Plan / Recommendation Clinical Impression  Speech language evaluation completed while Pt was sitting upright in chair. Pt achieved a score of 26/30 on the [facility] SLUMS examination revealing cognition to be [facility]. Pt demonstrated a slight delay with generating words when asked to name objects (for 2/10 objects), however Pt did eventually come up with the targeted words. Pt reports difficulty generating words and "thinking of what he wants to say" is new in the last week or two. SLP reviewed strategies for slowing down and taking his time when trying to generate words or a complex thought. Pt does live alone and is completely independent; He demonstrated slight difficulty with novel memory but no difficulty with calculations, thought organization and problem solving.  SLP reviewed memory strategies; there is no need for further ST services in acute setting and [place] does not seem to be indicated at this time; Pt is functioning very close to if not at baseline. ST will sign off at this time. Thank you for this referral,    SLP Assessment  SLP Recommendation/Assessment: Patient does not need any further Speech Lanaguage Pathology Services    Recommendations for  follow up therapy are one component of a multi-disciplinary discharge planning process, led by the attending physician.  Recommendations may be updated based on patient status, additional functional criteria and insurance authorization.    Follow Up Recommendations  No SLP follow up    Assistance Recommended at Discharge  None           SLP Evaluation Cognition  Overall Cognitive Status: Within Functional Limits for tasks assessed       Comprehension  Auditory Comprehension Overall Auditory Comprehension: Appears within functional limits for tasks assessed Visual Recognition/Discrimination Discrimination: Not tested Reading Comprehension Reading Status: Within funtional limits    Expression Expression Primary Mode of Expression: Verbal Verbal Expression Overall Verbal Expression: Appears within functional limits for tasks assessed Written Expression Dominant Hand: Right   Oral / Motor  Oral Motor/Sensory Function Overall Oral Motor/Sensory Function: Within functional limits Motor Speech Overall Motor Speech: Appears within functional limits for tasks assessed           Noal Abshier H. Roddie Mc, CCC-SLP Speech Language Pathologist  Wende Bushy 06/12/2021, 11:11 AM

## 2021-06-12 NOTE — Care Management Obs Status (Signed)
[place] NOTIFICATION   Patient Details  Name: Ronnie Lawrence MRN: [id number] Date of Birth: 08-24-1927   Medicare Observation Status Notification Given:       Tommy Medal 06/12/2021, 9:02 PM

## 2021-06-12 NOTE — Plan of Care (Signed)
  Problem: Education: Goal: Knowledge of disease or condition will improve Outcome: Progressing Goal: Knowledge of secondary prevention will improve (SELECT ALL) Outcome: Progressing Goal: Knowledge of patient specific risk factors will improve (INDIVIDUALIZE FOR PATIENT) Outcome: Progressing Goal: Individualized Educational Video(s) Outcome: Progressing   Problem: Coping: Goal: Will verbalize positive feelings about self Outcome: Progressing Goal: Will identify appropriate support needs Outcome: Progressing   Problem: Education: Goal: Knowledge of General Education information will improve Description: Including pain rating scale, medication(s)/side effects and non-pharmacologic comfort measures Outcome: Progressing   Problem: Health Behavior/Discharge Planning: Goal: Ability to manage health-related needs will improve Outcome: Progressing   Problem: Clinical Measurements: Goal: Ability to maintain clinical measurements within normal limits will improve Outcome: Progressing Goal: Will remain free from infection Outcome: Progressing Goal: Diagnostic test results will improve Outcome: Progressing Goal: Respiratory complications will improve Outcome: Progressing Goal: Cardiovascular complication will be avoided Outcome: Progressing   Problem: Activity: Goal: Risk for activity intolerance will decrease Outcome: Progressing   Problem: Nutrition: Goal: Adequate nutrition will be maintained Outcome: Progressing   Problem: Coping: Goal: Level of anxiety will decrease Outcome: Progressing   Problem: Elimination: Goal: Will not experience complications related to bowel motility Outcome: Progressing Goal: Will not experience complications related to urinary retention Outcome: Progressing   Problem: Pain Managment: Goal: General experience of comfort will improve Outcome: Progressing   Problem: Safety: Goal: Ability to remain free from injury will improve Outcome:  Progressing   Problem: Skin Integrity: Goal: Risk for impaired skin integrity will decrease Outcome: Progressing

## 2021-06-12 NOTE — Evaluation (Signed)
Occupational Therapy Evaluation Patient Details Name: Ronnie Lawrence MRN: [id number] DOB: 1927-07-24 Today's Date: 06/12/2021   History of Present Illness Ronnie Lawrence is a 88 y.o. male with medical history significant of T2DM, HTN, hx of CVA, gout, atrial flutter on xarelto who presented to Ed for feeling tired and weak for a couple weeks. He has had poor PO intake for 2+ weeks and has had an upset stomach.  He states his speech started to slur about one week ago, but has gotten progressive worse and he may have had some trouble with his right leg. He denies any other focal deficits, confusion or weakness. He didn't want  to come to ER and family firmly suggested he come to ED today as they were concerned for stroke. (per MD)   Clinical Impression   Pt agreeable to OT and PT co-evaluation. Pt reports living within walking distance of daughter who can help as needed. Pt presents generally weak in B UE and with slow gait when transferring in room and hall.  In standing pt is at level of Mod I to supervision without AD. Pt was able to complete toilet transfer and donning of socks without difficulty. Pt would benefit from home health OT to increase B UE strength and overall endurance. Pt is not recommended for further acute OT services and will be discharged to care of nursing staff for remaining length of stay.      Recommendations for follow up therapy are one component of a multi-disciplinary discharge planning process, led by the attending physician.  Recommendations may be updated based on patient status, additional functional criteria and insurance authorization.   Follow Up Recommendations  Home health OT    Assistance Recommended at Discharge PRN  Patient can return home with the following A little help with walking and/or transfers;Help with stairs or ramp for entrance    Functional Status Assessment  Patient has had a recent decline in their functional status and demonstrates the  ability to make significant improvements in function in a reasonable and predictable amount of time.  Equipment Recommendations  None recommended by OT    Recommendations for Other Services       Precautions / Restrictions Precautions Precautions: None Restrictions Weight Bearing Restrictions: No      Mobility Bed Mobility Overal bed mobility: Modified Independent             General bed mobility comments: mild labored movement with pt using momentum to sit upright    Transfers Overall transfer level: Modified independent (Mod I to supervision.) Equipment used:  (gait belt)               General transfer comment: Pt able to transfer to chair and ambulate in hall with slow gait but without physical assistance.      Balance Overall balance assessment: Mild deficits observed, not formally tested                                         ADL either performed or assessed with clinical judgement   ADL Overall ADL's : Modified independent                                       General ADL Comments: Pt able to don socks without assist and complete  toilet transfer with mild labored movement without AD.     Vision Baseline Vision/History: 1 Wears glasses Ability to See in Adequate Light: 1 Impaired Patient Visual Report: No change from baseline Vision Assessment?: No apparent visual deficits     Perception     Praxis      Pertinent Vitals/Pain Pain Assessment Pain Assessment: No/denies pain     Hand Dominance Right   Extremity/Trunk Assessment Upper Extremity Assessment Upper Extremity Assessment: Generalized weakness   Lower Extremity Assessment Lower Extremity Assessment: Defer to PT evaluation   Cervical / Trunk Assessment Cervical / Trunk Assessment: Normal   Communication Communication Communication: HOH   Cognition Arousal/Alertness: Awake/alert Behavior During Therapy: WFL for tasks  assessed/performed Overall Cognitive Status: Within Functional Limits for tasks assessed                                                        Home Living Family/patient expects to be discharged to:: Private residence Living Arrangements: Alone Available Help at Discharge: Family;Available PRN/intermittently Type of Home: House Home Access: Level entry     Home Layout: One level (1 stair within house)     Bathroom Shower/Tub: Walk-in shower;Tub/shower unit         Home Equipment: Conservation officer, nature (2 wheels);Grab bars - tub/shower;Shower seat - built in;Other (comment) (walking stick)   Additional Comments: Pt reports daughter lives within walking distace.      Prior Functioning/Environment Prior Level of Function : Independent/Modified Independent             Mobility Comments: Household ambulator with RW PRN. Walking stick used when ambulating outside. ADLs Comments: Independent ADL's; Able to cook and clean. Daughter assisting with groceries.        OT Problem List: Decreased strength;Decreased range of motion;Decreased activity tolerance;Impaired balance (sitting and/or standing)      OT Treatment/Interventions:      OT Goals(Current goals can be found in the care plan section) Acute Rehab OT Goals Patient Stated Goal: return home  OT Frequency:      Co-evaluation PT/OT/SLP Co-Evaluation/Treatment: Yes Reason for Co-Treatment: To address functional/ADL transfers   OT goals addressed during session: ADL's and self-care      AM-PAC OT "6 Clicks" Daily Activity     Outcome Measure Help from another person eating meals?: None Help from another person taking care of personal grooming?: None Help from another person toileting, which includes using toliet, bedpan, or urinal?: None Help from another person bathing (including washing, rinsing, drying)?: None Help from another person to put on and taking off regular upper body clothing?:  None Help from another person to put on and taking off regular lower body clothing?: None 6 Click Score: 24   End of Session Equipment Utilized During Treatment: Gait belt  Activity Tolerance: Patient tolerated treatment well Patient left: in chair;with call bell/phone within reach  OT Visit Diagnosis: Unsteadiness on feet (R26.81);Other abnormalities of gait and mobility (R26.89);Muscle weakness (generalized) (M62.81);Other symptoms and signs involving the nervous system (Z02.585)                Time: 2778-2423 OT Time Calculation (min): 21 min Charges:  OT General Charges $OT Visit: 1 Visit OT Evaluation $OT Eval Low Complexity: 1 Low  Velma Hanna OT, MOT  Larey Seat 06/12/2021, 9:35 AM

## 2021-06-12 NOTE — Consult Note (Signed)
@LOGO @   Referring Provider: Godfrey Pick, MD Primary Care Physician:  [name], MD Primary Gastroenterologist:  Dr. Laural Golden  Date of Admission: 06/11/21 Date of Consultation: 06/12/21  Reason for Consultation:  Anemia, heme positive stool  HPI:  Ronnie Lawrence is a 88 y.o. year old male with medical history significant of T2DM, HTN, hx of CVA, gout, atrial flutter on xarelto, history of AV block s/p pacemaker in 2021, colonic adenoma in the distant past, who presented to ED for feeling tired and weak for a couple weeks, decreased po intake, and upset stomach. Also noticed slurred speech starting about 1 week ago with progressive worsening and possible weakness in right leg.   ER Course:  VSS  Pertinent labs: INR 1.8, BUN: 27, creatinine: 1.26, WBC; 34.5, hgb: 10.7, lactic acid: 1.9>2.3, fecal occult: POSITIVE Ct head: increased size/conspicuity of an area of hypodensity in the right parietal lobe, potentially evolution of previously seen infarct or acute on chronic infarct.  CXR: There is 3.8 cm homogeneous opacity in the left parahilar region suggesting possible malignant neoplasm. Less likely possibility would be pneumonia. Follow-up CT chest may be considered.   CTA chest: There is 4.7 x 3.8 cm mass lesion with spiculated margins in the anterior left mid lung fields in the left upper lobe. The lesion is extending to the pleura with focal pleural thickening. This lesion has to be considered a malignant neoplasm until proven otherwise. Follow-up PET-CT and biopsy should be considered. Slightly  enlarged lymph nodes in the mediastinum largest measuring 1.5x1cm in aortapulmonary window. May suggest incidental reactive hyperplasia of nodes or metastatic disease.    There is low-attenuation in the body of L1 vertebra with coarse trabeculae suggesting possible hemangioma. Less likely possibility would be skeletal metastatic disease. This finding could also be evaluated in the PET-CT.    2.8cm nodule in left adrenal, possibly adenoma. Moderate hiatal hernia. Gallstones.   Neurology was consulted by ED physician and recommended transfer to [facility] for MRI for possible new CVA; however, patient declined. Given 1L LR bolus, protonix BID. Wrote for 1 unit PRBC.   Today:  2 daughters at bedside today.  Patient reports he came to the hospital because his daughters made him due to decreased appetite, weight loss, weakness for the last few weeks to a month.  Reports he is also 15 pounds in the last month, but more than that over the last 6 months.  Previously weighed 190 pounds 6 months ago.  Denies nausea, vomiting, reflux symptoms, dysphagia, abdominal pain.  For the 3 to 4 weeks, he noticed change in bowel habits to more frequent bowel movements, typically postprandial.  Consistency was loose.  No formed.  No watery stool.  No nocturnal stools.  Denies BRBPR or melena, recent antibiotics, sick contacts, recent viral illness.  He does drink well water. Prior to this, would have 1-2 normal stools daily. Now hasn't had a BM in the last couple of days. Passing gas.   History of stroke that caused slurred speech about 6 months ago, but had been improving. However, noticed worsening of slurred speech again over the last couple weeks as well as some trouble thinking of what he wants to say, but now feels like it is getting better again.  Doesn't want to have MRI for stroke work-up.  Colonoscopy 08/09/2013 with pancolonic diverticulosis, small external hemorrhoids.  Mother with colon cancer in her 92s.   Last dose of Xarelto was Sunday. No NSAIDs. Has been on colchicine for  the last 6 weeks.   No hematuria.  3 nose bleeds in the last month for 10-15 minutes.   Doesn't want procedures. Wants to go home. Would like to follow-up outpatient for consideration of procedures if absolutely necessary.  However, then states, "I am 42, I have lived a good life, if I die, I die".  Daughter states  their primary focus is to keep him comfortable.   Received notification from nurse today around 12:30 pm that patient now wants to have procedures.   Past Medical History:  Diagnosis Date   Asthma    Diabetes mellitus    Gout    Hypertension    Stroke [facility])    Wears dentures    partial upper    Past Surgical History:  Procedure Laterality Date   BACK SURGERY     CATARACT EXTRACTION W/PHACO Left 08/31/2019   Procedure: CATARACT EXTRACTION PHACO AND INTRAOCULAR LENS PLACEMENT (IOC) LEFT DIABETIC 9.58 00:51.0;  Surgeon: Birder Robson, MD;  Location: [place];  Service: Ophthalmology;  Laterality: Left;  Diabetic - oral meds   CATARACT EXTRACTION W/PHACO Right 09/21/2019   Procedure: CATARACT EXTRACTION PHACO AND INTRAOCULAR LENS PLACEMENT (IOC) RIGHT DIABETIC 13.56  01:10.7;  Surgeon: Birder Robson, MD;  Location: [place];  Service: Ophthalmology;  Laterality: Right;   COLONOSCOPY N/A 08/13/2013   Procedure: COLONOSCOPY;  Surgeon: Rogene Houston, MD;  [facility];  Service: Endoscopy;  Laterality: N/A;  [place]     bilateral   PACEMAKER IMPLANT N/A 10/11/2019   Procedure: PACEMAKER IMPLANT;  Surgeon: Vickie Epley, MD;  Location: [place] [facility];  Service: Cardiovascular;  Laterality: N/A;    Prior to Admission medications   Medication Sig Start Date End Date Taking? Authorizing Provider  allopurinol (ZYLOPRIM) 300 MG tablet Take 300 mg by mouth daily. 03/21/21  Yes [provider]  atorvastatin (LIPITOR) 10 MG tablet Take 10 mg by mouth at bedtime.   Yes [provider]  colchicine 0.6 MG tablet Take 2 tablets (1.2 mg) by mouth when you pick up your prescription.  2 hours after your first dose take one tablet.  12 hours later take one tablet.  Call your PCP for further medication/instructions. Patient taking differently: Take 0.6 mg by mouth daily. 04/19/20  Yes Vickie Epley, MD  magnesium gluconate  (MAGONATE) 500 MG tablet Take 500 mg by mouth daily.   Yes [provider]  metFORMIN (GLUCOPHAGE-XR) 500 MG 24 hr tablet Take 500 mg by mouth 2 (two) times daily.   Yes [provider]  rivaroxaban (XARELTO) 20 MG TABS tablet Take 1 tablet (20 mg total) by mouth daily with supper. 04/12/20  Yes Vickie Epley, MD    Current Facility-Administered Medications  Medication Dose Route Frequency Provider Last Rate Last Admin   0.9 %  sodium chloride infusion  10 mL/hr Intravenous Once Godfrey Pick, MD       acetaminophen (TYLENOL) tablet 650 mg  650 mg Oral Q4H PRN Orma Flaming, MD       Or   acetaminophen (TYLENOL) 160 MG/5ML solution 650 mg  650 mg Per Tube Q4H PRN Orma Flaming, MD       Or   acetaminophen (TYLENOL) suppository 650 mg  650 mg Rectal Q4H PRN Orma Flaming, MD       allopurinol (ZYLOPRIM) tablet 300 mg  300 mg Oral Daily Orma Flaming, MD   300 mg at 06/11/21 2124  atorvastatin (LIPITOR) tablet 10 mg  10 mg Oral QHS Orma Flaming, MD   10 mg at 06/11/21 2123   insulin aspart (novoLOG) injection 0-9 Units  0-9 Units Subcutaneous TID WC Orma Flaming, MD       pantoprazole (PROTONIX) injection 40 mg  40 mg Intravenous Q12H Godfrey Pick, MD   40 mg at 06/11/21 1635    Allergies as of 06/11/2021   (No Known Allergies)    History reviewed. No pertinent family history.  Social History   Socioeconomic History   Marital status: Widowed    Spouse name: Not on file   Number of children: Not on file   Years of education: Not on file   Highest education level: Not on file  Occupational History   Not on file  Tobacco Use   Smoking status: Former    Types: Cigarettes    Quit date: 73    Years since quitting: 42.4   Smokeless tobacco: Never  Vaping Use   Vaping Use: Never used  Substance and Sexual Activity   Alcohol use: Yes    Alcohol/week: 4.0 standard drinks    Types: 4 Standard drinks or equivalent per week    Comment: 3-4 a week   Drug  use: No   Sexual activity: Not on file  Other Topics Concern   Not on file  Social History Narrative   Not on file   Social Determinants of Health   Financial Resource Strain: Not on file  Food Insecurity: Not on file  Transportation Needs: Not on file  Physical Activity: Not on file  Stress: Not on file  Social Connections: Not on file  Intimate Partner Violence: Not on file    Review of Systems: Gen: Denies fever, chills, cold or flulike symptoms, presyncope, syncope. CV: Denies chest pain, heart palpitations. Resp: Denies shortness of breath or cough. GI: See HPI GU : Denies urinary burning, urinary frequency, urinary incontinence.  MS: Denies joint pain. Derm: Denies rash. Psych: Denies depression, anxiety. Heme: See HPI  Physical Exam: Vital signs in last 24 hours: Temp:  [97.6 F (36.4 C)-98.6 F (37 C)] 98.1 F (36.7 C) (05/23 0422) Pulse Rate:  [67-96] 73 (05/23 0422) Resp:  [13-26] 18 (05/23 0422) BP: (100-140)/(50-79) 107/50 (05/23 0422) SpO2:  [94 %-100 %] 97 % (05/23 0422) Weight:  [73.5 kg] 73.5 kg (05/22 1118) Last BM Date : 06/11/21 General:   Alert,  pleasant and cooperative in NAD, frail appearing, but overall well developed and normal weight. Exam completed in bedside recliner.  Head:  Normocephalic and atraumatic. Eyes:  Sclera clear, no icterus.   Conjunctiva pink. Ears:  Normal auditory acuity. Lungs:  Clear throughout to auscultation.   No wheezes, crackles, or rhonchi. No acute distress. Heart:  Regular rate and rhythm; no murmurs, clicks, rubs,  or gallops. Abdomen:  Soft, nontender and nondistended. No masses, hepatosplenomegaly or hernias noted. Normal bowel sounds, without guarding, and without rebound.   Rectal:  Deferred.  Msk:  Symmetrical without gross deformities. Normal posture. Extremities:  Without edema. Neurologic:  Alert and  oriented x4;  grossly normal neurologically. Skin:  Intact without significant lesions or  rashes. Psych:  Normal mood and affect.  Intake/Output from previous day: 05/22 0701 - 05/23 0700 In: 366 [P.O.:100; Blood:266] Out: -  Intake/Output this shift: No intake/output data recorded.  Lab Results: Recent Labs    06/11/21 1140 06/11/21 2219 06/12/21 0130  WBC 34.5* 33.6* 31.4*  HGB 10.7* 10.0* 9.7*  HCT  34.0* 32.3* 31.5*  PLT 454* 376 365   BMET Recent Labs    06/11/21 1140 06/12/21 0130  NA 136 138  K 4.3 4.2  CL 105 108  CO2 26 25  GLUCOSE 141* 100*  BUN 27* 19  CREATININE 1.26* 1.05  CALCIUM 8.9 8.4*   LFT Recent Labs    06/11/21 1140  PROT 6.8  ALBUMIN 3.4*  AST 39  ALT 28  ALKPHOS 142*  BILITOT 0.5   PT/INR Recent Labs    06/11/21 1140  LABPROT 21.0*  INR 1.8*    Studies/Results: DG Chest 1 View  Result Date: 06/11/2021 CLINICAL DATA:  Generalized weakness, slurred speech EXAM: CHEST  1 VIEW COMPARISON:  10/11/2019 FINDINGS: Cardiac size is within normal limits. There are no signs of pulmonary edema. There is 3.8 cm homogeneous nodule with slightly spiculated margins in the left parahilar region. Low position of diaphragms may suggest COPD. Linear densities in the left lower lung fields have not changed suggesting scarring. Pacemaker battery is seen in the left infraclavicular region. IMPRESSION: There is 3.8 cm homogeneous opacity in the left parahilar region suggesting possible malignant neoplasm. Less likely possibility would be pneumonia. Follow-up CT chest may be considered. Electronically Signed   By: Elmer Picker M.D.   On: 06/11/2021 12:05   CT HEAD WO CONTRAST  Result Date: 06/11/2021 CLINICAL DATA:  Neuro deficit, acute, stroke suspected EXAM: CT HEAD WITHOUT CONTRAST TECHNIQUE: Contiguous axial images were obtained from the base of the skull through the vertex without intravenous contrast. RADIATION DOSE REDUCTION: This exam was performed according to the departmental dose-optimization program which includes automated  exposure control, adjustment of the mA and/or kV according to patient size and/or use of iterative reconstruction technique. COMPARISON:  CT head November 03, 2020. FINDINGS: Brain: Increased size/conspicuity of and area of hypodensity in the right parietal lobe. No acute hemorrhage, mass lesion, midline shift, hydrocephalus. Cerebral atrophy. Chronic microvascular disease. Vascular: No hyperdense vessel identified. Skull: No acute fracture. Sinuses/Orbits: Left maxillary sinus mucosal thickening. No acute orbital findings. Other: No mastoid effusions. IMPRESSION: Increased size/conspicuity of an area of hypodensity in the right parietal lobe, potentially evolution of previously seen infarct or acute on chronic infarct. If there is concern for acute infarct, recommend MRI. Electronically Signed   By: Margaretha Sheffield M.D.   On: 06/11/2021 12:00   CT Angio Chest PE W and/or Wo Contrast  Result Date: 06/11/2021 CLINICAL DATA:  Abnormal chest radiographs, high clinical suspicion for pulmonary embolism EXAM: CT ANGIOGRAPHY CHEST WITH CONTRAST TECHNIQUE: Multidetector CT imaging of the chest was performed using the standard protocol during bolus administration of intravenous contrast. Multiplanar CT image reconstructions and MIPs were obtained to evaluate the vascular anatomy. RADIATION DOSE REDUCTION: This exam was performed according to the departmental dose-optimization program which includes automated exposure control, adjustment of the mA and/or kV according to patient size and/or use of iterative reconstruction technique. CONTRAST:  41mL OMNIPAQUE IOHEXOL 350 MG/ML SOLN COMPARISON:  Previous studies including the chest radiographs done earlier today FINDINGS: Cardiovascular: Heart is enlarged in size. Pacemaker leads are noted in place. Coronary artery calcifications are seen. There is homogeneous enhancement in thoracic aorta. There is ectasia of ascending thoracic aorta measuring 3.8 cm. There are  calcifications and atherosclerotic plaques in the thoracic aorta. There are no intraluminal filling defects in the pulmonary artery branches. Mediastinum/Nodes: There are few slightly enlarged lymph nodes in mediastinum and hilar regions largest measuring 1.5 x 1 cm in the aortopulmonary window.  Lungs/Pleura: There is 4.7 x 3.8 cm noncalcified mass in the anterior left mid lung fields. Margins are slightly spiculated. This lesion is extending to the pleura with pleural thickening. There is narrowing of traversing pulmonary vascular branches within the lesion. Centrilobular emphysema is seen. Increased interstitial markings are seen in the periphery of both lungs, more so in the lower lung fields. There are multiple blebs and bullae in the periphery of both lungs. There is no pleural effusion or pneumothorax. Upper Abdomen: There is fatty infiltration in the liver. Small calcific density in the gallbladder suggests gallbladder stones. There is moderate sized hiatal hernia. There is 2.8 cm nodule in the left adrenal. Density measurements are less than 10 Hounsfield units suggesting possible adenoma. Musculoskeletal: Degenerative changes are noted in the thoracic spine and visualized lower cervical spine. There is low attenuation with coarse trabeculae in the body of L1 vertebra. This finding is not optimally evaluated. This may suggest benign process such as hemangioma or metastatic disease. Review of the MIP images confirms the above findings. IMPRESSION: There is 4.7 x 3.8 cm mass lesion with spiculated margins in the anterior left mid lung fields in the left upper lobe. The lesion is extending to the pleura with focal pleural thickening. This lesion has to be considered a malignant neoplasm until proven otherwise. Follow-up PET-CT and biopsy should be considered. There are slightly enlarged lymph nodes in the mediastinum largest measuring 1.5 x 1 cm in the aortopulmonary window. This may suggest incidental reactive  hyperplasia of nodes or metastatic disease. There is low-attenuation in the body of L1 vertebra with coarse trabeculae suggesting possible hemangioma. Less likely possibility would be skeletal metastatic disease. This finding could also be evaluated in the PET-CT. There is 2.8 cm nodule in the left adrenal, possibly adenoma. This finding could also be evaluated in the PET-CT. 0 Fatty liver. Gallbladder stones. There is moderate sized hiatal hernia. Coronary artery calcifications are seen. Other findings as described in the body of the report. Electronically Signed   By: Elmer Picker M.D.   On: 06/11/2021 13:47    Impression: 88 y.o. year old male with medical history significant of T2DM, HTN, hx of CVA, gout, atrial flutter on xarelto, history of AV block s/p pacemaker in 2021, colonic adenoma in the distant past, who presented to ED for feeling tired and weak for a couple weeks, decreased po intake, and change in bowel habits. Also noticed some slurring of speech and difficulty finding words.  He was found to have normocytic anemia (10.7) with heme positive stool, CT head with possible evolution of primary infarct or acute on chronic infarct, CTA chest with 4.7 x 3.8 cm mass lesion in the left upper lobe concerning for malignant neoplasm.  Neurology was consulted by ED physician and recommended transfer to [facility] for MRI for possible new CVA, but patient declined. GI consulted due to anemia with heme positive stool.   Anemia with heme positive stool:  Hemoglobin 10.0 on admission, down from 15.0 on 03/01/2021.  He received 1 unit PRBCs yesterday with hemoglobin improved to 11.1 today.  Denies overt GI bleeding.  Over the last month, he has had decreased appetite with poor p.o. intake.  Patient reports 15 pound weight loss over the last month, but per chart review, appears she is lost 3 pounds in the last 2 months, 20 pounds in the last 8 months.  He has also had change in bowel habits from 1-2 soft,  formed bowel movements daily, to  postprandial loose bowel movements without nocturnal stools, though no bowel movement in the last couple of days.  Denies watery stools.  Denies recent antibiotics, sick contacts.  He had been on colchicine for the last 6 weeks, but discontinued on admission.  Chronically on Xarelto with last dose on Sunday per patient.  Denies NSAIDs.  No prior EGD.  Last colonoscopy in July 2015 with pancolonic diverticulosis, small external hemorrhoids.  Etiology of acute anemia with heme positive stool is broad. He could be bleeding from essentially anywhere in his GI tract in the setting of Xarelto. Decreased appetite and change in bowel habits could have been a side effect of colchicine, but unable to rule out underlying malignancy.  Additional considerations include gastritis, duodenitis, PUD, AVMs, colonic polyps.  Also, with left upper lobe mass lesion, unable to rule out lung cancer contributing to anemia as well. Discussed the possibility of EGD and colonoscopy with patient and his daughter today.  Patient initially declined and requested outpatient follow-up.  However, I received a notification from the nurse around 12:30 PM stating patient had changed his mind and was requesting EGD and colonoscopy.  Considering concern for possible new CVA, will need to discuss with Dr. Jenetta Downer and anesthesia.  Neurology has seen patient today and recommended CT head and neck to further evaluate right parietal hypodensity and to rule out brain metastasis from potential lung malignancy.    Plan: Discuss candidacy for EGD and colonoscopy with Dr. Jenetta Downer and anesthesia. Continue to hold Xarelto. Last dose was Sunday per patient.  Continue PPI twice daily. Check iron panel with ferritin, B12, and folate. Continue to monitor H&H and for overt GI bleeding.  Transfuse as needed. Monitor for recurrent loose stools.    LOS: 0 days    06/12/2021, 9:11 AM   Aliene Altes, [facility]  Gastroenterology

## 2021-06-12 NOTE — Progress Notes (Signed)
PROGRESS NOTE     Ronnie Lawrence, is a 88 y.o. male, DOB - 01-Jan-1928, OAC:[id number]  Admit date - 06/11/2021   Admitting Physician Orma Flaming, MD  Outpatient Primary MD for the patient is Ronnie Noble, MD  LOS - 0  Chief Complaint  Patient presents with   Aphasia        Brief Narrative:   88 y.o. male with medical history significant of T2DM, HTN, hx of CVA, gout, atrial flutter on xarelto admitted on 06/11/2021 with a 5 g drop in hemoglobin and concerns for GI bleed with heme positive stools as well as increasing weakness concerning for speech and possible stroke symptoms    -Assessment and Plan: 1)Symptomatic acute anemia with GI bleed -suspect acute blood loss anemia in setting of presumed GI bleed Heme positive stools  on admission Hgb was down to 9.7 from a baseline usually above 15 -Hgb up to 11.1 after transfusion of 1 unit of PRBC -Patient agreeable to EGD and colonoscopy planned for 06/13/2021 -GI consult appreciated -PTA patient was taking colchicine -Continue PPI -Continue to hold Xarelto  2) strokelike symptoms in a patient with history of strokes -Speech concerns improving spontaneously -Unable to do MRI due to pacemaker in situ -Patient initially refused neuroimaging but later agreed to CT head and CTA head and neck--which failed to demonstrate any new acute strokes or LVO -Neurology input appreciated -Xarelto on hold -Continue Lipitor  3)Lung mass considered malignant until proven otherwise Already followed by oncology for his myeloproliferative disorder.  -Outpatient oncology follow-up for possible PET scan if patient desires advised -CTA head and neck without evidence of metastasis -Palliative care input appreciated  4)Failure to thrive in adult/Anorexia In setting of likely malignancy Dietitian  consult appreciated -Continue nutritional supplements and multivitamin -Give Remeron for appetite stimulation and insomnia  5)DM2- A1c 6.3 reflecting  excellent diabetic control PTA  -hold metformin due to  contrast exposure and n.p.o. status Use Novolog/Humalog Sliding scale insulin with Accu-Cheks/Fingersticks as ordered    6)Atrioventricular block, Mobitz type 1, Wenckebach s/p PPM  Followed by Dr. Quentin Ore He had a pacemaker implanted October 11, 2019. AF also has been detected on device interrogation  -  7)Atrial flutter /atrial fibrillation Noted on PPM interrogation in 01/2020 and started on xarelto given CHA2DS2-VAsc of 4 Holding in setting of GI bleed If okay with GI service after endoluminal evaluation patient will need to be restarted on Eliquis rather than Xarelto upon discharge due to slightly reduced risk of bleeding with the former compared to the later  8)Myeloproliferative disorder ([place]) CALR positive Followed by oncology.  New onset anemia -Patient recently has persistent leukocytosis - 9)Gout Continue allopurinol. Hold colchicine with GI bleed. Appears he was taking this daily  PTA  10) social/ethics--- plan of care and advanced directive discussed with patient and 2 daughters at bedside Ronnie Lawrence and Ronnie Lawrence) -Questions answered -DNR/DNI -Patient was initially reluctant to have GI and neuro evaluation, after talking to his daughter's patient is requesting GI and neuro evaluation  Disposition/Need for in-Hospital Stay- patient unable to be discharged at this time due to --presumed GI bleed with symptomatic anemia requiring further endoluminal evaluation -Possible discharge home in 24 to 48 hours if GI bleed stabilizes  Disposition: The patient is from: Home              Anticipated d/c is to: Home              Anticipated d/c date is: 1 day  Patient currently is not medically stable to d/c. Barriers: Not Clinically Stable-   Code Status :  -  Code Status: DNR   Family Communication:    (patient is alert, awake and coherent) -discussed with patient's 2 daughters at bedside Ronnie Lawrence and  Ronnie Lawrence)  DVT Prophylaxis  :   - SCDs   SCD's Start: 06/11/21 1837   Lab Results  Component Value Date   PLT 427 (H) 06/12/2021    Inpatient Medications  Scheduled Meds:  allopurinol  300 mg Oral Daily   atorvastatin  10 mg Oral QHS   feeding supplement  237 mL Oral BID BM   insulin aspart  0-9 Units Subcutaneous TID WC   multivitamin with minerals  1 tablet Oral Daily   pantoprazole (PROTONIX) IV  40 mg Intravenous Q12H   Continuous Infusions:  sodium chloride     PRN Meds:.acetaminophen **OR** acetaminophen (TYLENOL) oral liquid 160 mg/5 mL **OR** acetaminophen   Anti-infectives (From admission, onward)    None         Subjective: [name] today has no fevers, no emesis,  No chest pain,   -Denies shortness of breath or palpitations - Patient states fatigue, weakness and dizziness improving slowly after transfusion of PRBC -Patient's 2 daughters Ronnie Lawrence and Ronnie Lawrence at bedside questions answered  Objective: Vitals:   06/12/21 0024 06/12/21 0422 06/12/21 1109 06/12/21 1257  BP: 109/67 (!) 107/50 (!) 126/58 (!) 114/51  Pulse: 75 73 77 73  Resp: 20 18 20 20   Temp: 98.6 F (37 C) 98.1 F (36.7 C) 98 F (36.7 C) 98.8 F (37.1 C)  TempSrc:    Oral  SpO2: 95% 97% 93% 97%  Weight:      Height:        Intake/Output Summary (Last 24 hours) at 06/12/2021 1819 Last data filed at 06/12/2021 1257 Gross per 24 hour  Intake 616 ml  Output --  Net 616 ml   Filed Weights   06/11/21 1118  Weight: 73.5 kg    Physical Exam  Gen:- Awake Alert,  in no apparent distress  HEENT:- [place].AT, No sclera icterus Neck-Supple Neck,No JVD,.  Lungs-  CTAB , fair symmetrical air movement CV- S1, S2 normal, regular , left subclavian area with pacemaker in situ Abd-  +ve B.Sounds, Abd Soft, No tenderness,    Extremity/Skin:- No  edema, pedal pulses present  Psych-affect is appropriate, oriented x3 Neuro-generalized weakness without additional new focal deficits, no  tremors  Data Reviewed: I have personally reviewed following labs and imaging studies  CBC: Recent Labs  Lab 06/11/21 1140 06/11/21 2219 06/12/21 0130 06/12/21 1030  WBC 34.5* 33.6* 31.4* 33.9*  NEUTROABS 29.3*  --   --   --   HGB 10.7* 10.0* 9.7* 11.1*  HCT 34.0* 32.3* 31.5* 34.8*  MCV 95.2 95.3 94.9 94.8  PLT 454* 376 365 751*   Basic Metabolic Panel: Recent Labs  Lab 06/11/21 1140 06/12/21 0130  NA 136 138  K 4.3 4.2  CL 105 108  CO2 26 25  GLUCOSE 141* 100*  BUN 27* 19  CREATININE 1.26* 1.05  CALCIUM 8.9 8.4*  MG 2.1  --    GFR: Estimated Creatinine Clearance: 47.3 mL/min (by C-G formula based on SCr of 1.05 mg/dL). Liver Function Tests: Recent Labs  Lab 06/11/21 1140  AST 39  ALT 28  ALKPHOS 142*  BILITOT 0.5  PROT 6.8  ALBUMIN 3.4*   Cardiac Enzymes: No results for input(s): CKTOTAL, CKMB, CKMBINDEX, TROPONINI in the  last 168 hours. BNP (last 3 results) No results for input(s): PROBNP in the last 8760 hours. HbA1C: Recent Labs    06/11/21 2219  HGBA1C 6.3*   Sepsis Labs: @LABRCNTIP (procalcitonin:4,lacticidven:4) ) Recent Results (from the past 240 hour(s))  Resp Panel by RT-PCR (Flu A&B, Covid) Nasopharyngeal Swab     Status: None   Collection Time: 06/11/21 12:38 PM   Specimen: Nasopharyngeal Swab; Nasopharyngeal(NP) swabs in vial transport medium  Result Value Ref Range Status   SARS Coronavirus 2 by RT PCR NEGATIVE NEGATIVE Final    Comment: (NOTE) SARS-CoV-2 target nucleic acids are NOT DETECTED.  The SARS-CoV-2 RNA is generally detectable in upper respiratory specimens during the acute phase of infection. The lowest concentration of SARS-CoV-2 viral copies this assay can detect is 138 copies/mL. A negative result does not preclude SARS-Cov-2 infection and should not be used as the sole basis for treatment or other patient management decisions. A negative result may occur with  improper specimen collection/handling, submission of  specimen other than nasopharyngeal swab, presence of viral mutation(s) within the areas targeted by this assay, and inadequate number of viral copies(<138 copies/mL). A negative result must be combined with clinical observations, patient history, and epidemiological information. The expected result is Negative.  Fact Sheet for Patients:  EntrepreneurPulse.com.au  Fact Sheet for Healthcare Providers:  IncredibleEmployment.be  This test is no t yet approved or cleared by the [place] FDA and  has been authorized for detection and/or diagnosis of SARS-CoV-2 by FDA under an Emergency Use Authorization (EUA). This EUA will remain  in effect (meaning this test can be used) for the duration of the COVID-19 declaration under Section 564(b)(1) of the Act, 21 U.S.C.section 360bbb-3(b)(1), unless the authorization is terminated  or revoked sooner.       Influenza A by PCR NEGATIVE NEGATIVE Final   Influenza B by PCR NEGATIVE NEGATIVE Final    Comment: (NOTE) The Xpert Xpress SARS-CoV-2/FLU/RSV plus assay is intended as an aid in the diagnosis of influenza from Nasopharyngeal swab specimens and should not be used as a sole basis for treatment. Nasal washings and aspirates are unacceptable for Xpert Xpress SARS-CoV-2/FLU/RSV testing.  Fact Sheet for Patients: EntrepreneurPulse.com.au  Fact Sheet for Healthcare Providers: IncredibleEmployment.be  This test is not yet approved or cleared by the [place] FDA and has been authorized for detection and/or diagnosis of SARS-CoV-2 by FDA under an Emergency Use Authorization (EUA). This EUA will remain in effect (meaning this test can be used) for the duration of the COVID-19 declaration under Section 564(b)(1) of the Act, 21 U.S.C. section 360bbb-3(b)(1), unless the authorization is terminated or revoked.  Performed at [facility], [address],  [place], [place] [address]   Blood culture (routine x 2)     Status: None (Preliminary result)   Collection Time: 06/11/21 12:41 PM   Specimen: BLOOD RIGHT FOREARM  Result Value Ref Range Status   Specimen Description BLOOD RIGHT FOREARM  Final   Special Requests   Final    BOTTLES DRAWN AEROBIC AND ANAEROBIC Blood Culture results may not be optimal due to an excessive volume of blood received in culture bottles   Culture   Final    NO GROWTH < 24 HOURS Performed at [facility], [address] [place], [place] [address]    Report Status PENDING  Incomplete  Blood culture (routine x 2)     Status: None (Preliminary result)   Collection Time: 06/11/21 12:42 PM   Specimen: BLOOD LEFT FOREARM  Result Value Ref Range Status   Specimen Description BLOOD LEFT FOREARM  Final   Special Requests   Final    BOTTLES DRAWN AEROBIC AND ANAEROBIC Blood Culture adequate volume   Culture   Final    NO GROWTH < 24 HOURS Performed at [facility], [address] [place], [place] [address]    Report Status PENDING  Incomplete      Radiology Studies: CT ANGIO HEAD NECK W WO CM  Result Date: 06/12/2021 CLINICAL DATA:  Stroke.  Slurred speech and unsteady gait. EXAM: CT ANGIOGRAPHY HEAD AND NECK TECHNIQUE: Multidetector CT imaging of the head and neck was performed using the standard protocol during bolus administration of intravenous contrast. Multiplanar CT image reconstructions and MIPs were obtained to evaluate the vascular anatomy. Carotid stenosis measurements (when applicable) are obtained utilizing NASCET criteria, using the distal internal carotid diameter as the denominator. RADIATION DOSE REDUCTION: This exam was performed according to the departmental dose-optimization program which includes automated exposure control, adjustment of the mA and/or kV according to patient size and/or use of iterative reconstruction technique. CONTRAST:  55mL OMNIPAQUE IOHEXOL 350 MG/ML SOLN COMPARISON:  CT head  06/11/2021 FINDINGS: CT HEAD FINDINGS Brain: Generalized atrophy. Negative for hydrocephalus. Chronic infarct right parietal lobe unchanged. Chronic microvascular ischemic change in the white matter stable. Negative for acute infarct, hemorrhage, mass Vascular: Negative for hyperdense vessel Skull: Negative Sinuses: Negative Orbits: Bilateral cataract extraction Review of the MIP images confirms the above findings CTA NECK FINDINGS Aortic arch: Atherosclerotic calcification aortic arch and proximal great vessels. No significant stenosis or dissection. Left vertebral artery origin from the arch. Right carotid system: Atherosclerotic calcification right carotid bifurcation. Negative for stenosis. Left carotid system: Atherosclerotic calcification left common carotid artery, mild. Atherosclerotic calcification left carotid bifurcation without stenosis. Vertebral arteries: Both vertebral arteries patent to the skull base without stenosis. Skeleton: Cervical kyphosis and spondylosis. No acute skeletal finding. Other neck: Negative for mass or adenopathy in the neck. Upper chest: Apical emphysema.  Left-sided pacemaker. Review of the MIP images confirms the above findings CTA HEAD FINDINGS Anterior circulation: Atherosclerotic calcifications at the cavernous carotid bilaterally without significant stenosis. Anterior and middle cerebral arteries patent without stenosis or vascular malformation Posterior circulation: Both vertebral arteries patent to the basilar. Left PICA patent. Right PICA not visualized. Basilar widely patent. AICA, superior cerebellar, posterior cerebral arteries patent without stenosis or vascular malformation. Venous sinuses: Normal venous enhancement. Anatomic variants: None Review of the MIP images confirms the above findings IMPRESSION: CT head negative for acute abnormality. Atrophy and chronic ischemic changes stable from yesterday Negative for intracranial large vessel occlusion Atherosclerotic  disease in the aortic arch and carotid bifurcation bilaterally without significant stenosis. Electronically Signed   By: Franchot Gallo M.D.   On: 06/12/2021 15:09   DG Chest 1 View  Result Date: 06/11/2021 CLINICAL DATA:  Generalized weakness, slurred speech EXAM: CHEST  1 VIEW COMPARISON:  10/11/2019 FINDINGS: Cardiac size is within normal limits. There are no signs of pulmonary edema. There is 3.8 cm homogeneous nodule with slightly spiculated margins in the left parahilar region. Low position of diaphragms may suggest COPD. Linear densities in the left lower lung fields have not changed suggesting scarring. Pacemaker battery is seen in the left infraclavicular region. IMPRESSION: There is 3.8 cm homogeneous opacity in the left parahilar region suggesting possible malignant neoplasm. Less likely possibility would be pneumonia. Follow-up CT chest may be considered. Electronically Signed   By: Elmer Picker M.D.   On:  06/11/2021 12:05   CT HEAD WO CONTRAST  Result Date: 06/11/2021 CLINICAL DATA:  Neuro deficit, acute, stroke suspected EXAM: CT HEAD WITHOUT CONTRAST TECHNIQUE: Contiguous axial images were obtained from the base of the skull through the vertex without intravenous contrast. RADIATION DOSE REDUCTION: This exam was performed according to the departmental dose-optimization program which includes automated exposure control, adjustment of the mA and/or kV according to patient size and/or use of iterative reconstruction technique. COMPARISON:  CT head November 03, 2020. FINDINGS: Brain: Increased size/conspicuity of and area of hypodensity in the right parietal lobe. No acute hemorrhage, mass lesion, midline shift, hydrocephalus. Cerebral atrophy. Chronic microvascular disease. Vascular: No hyperdense vessel identified. Skull: No acute fracture. Sinuses/Orbits: Left maxillary sinus mucosal thickening. No acute orbital findings. Other: No mastoid effusions. IMPRESSION: Increased size/conspicuity  of an area of hypodensity in the right parietal lobe, potentially evolution of previously seen infarct or acute on chronic infarct. If there is concern for acute infarct, recommend MRI. Electronically Signed   By: Margaretha Sheffield M.D.   On: 06/11/2021 12:00   CT Angio Chest PE W and/or Wo Contrast  Result Date: 06/11/2021 CLINICAL DATA:  Abnormal chest radiographs, high clinical suspicion for pulmonary embolism EXAM: CT ANGIOGRAPHY CHEST WITH CONTRAST TECHNIQUE: Multidetector CT imaging of the chest was performed using the standard protocol during bolus administration of intravenous contrast. Multiplanar CT image reconstructions and MIPs were obtained to evaluate the vascular anatomy. RADIATION DOSE REDUCTION: This exam was performed according to the departmental dose-optimization program which includes automated exposure control, adjustment of the mA and/or kV according to patient size and/or use of iterative reconstruction technique. CONTRAST:  37mL OMNIPAQUE IOHEXOL 350 MG/ML SOLN COMPARISON:  Previous studies including the chest radiographs done earlier today FINDINGS: Cardiovascular: Heart is enlarged in size. Pacemaker leads are noted in place. Coronary artery calcifications are seen. There is homogeneous enhancement in thoracic aorta. There is ectasia of ascending thoracic aorta measuring 3.8 cm. There are calcifications and atherosclerotic plaques in the thoracic aorta. There are no intraluminal filling defects in the pulmonary artery branches. Mediastinum/Nodes: There are few slightly enlarged lymph nodes in mediastinum and hilar regions largest measuring 1.5 x 1 cm in the aortopulmonary window. Lungs/Pleura: There is 4.7 x 3.8 cm noncalcified mass in the anterior left mid lung fields. Margins are slightly spiculated. This lesion is extending to the pleura with pleural thickening. There is narrowing of traversing pulmonary vascular branches within the lesion. Centrilobular emphysema is seen. Increased  interstitial markings are seen in the periphery of both lungs, more so in the lower lung fields. There are multiple blebs and bullae in the periphery of both lungs. There is no pleural effusion or pneumothorax. Upper Abdomen: There is fatty infiltration in the liver. Small calcific density in the gallbladder suggests gallbladder stones. There is moderate sized hiatal hernia. There is 2.8 cm nodule in the left adrenal. Density measurements are less than 10 Hounsfield units suggesting possible adenoma. Musculoskeletal: Degenerative changes are noted in the thoracic spine and visualized lower cervical spine. There is low attenuation with coarse trabeculae in the body of L1 vertebra. This finding is not optimally evaluated. This may suggest benign process such as hemangioma or metastatic disease. Review of the MIP images confirms the above findings. IMPRESSION: There is 4.7 x 3.8 cm mass lesion with spiculated margins in the anterior left mid lung fields in the left upper lobe. The lesion is extending to the pleura with focal pleural thickening. This lesion has to be considered a  malignant neoplasm until proven otherwise. Follow-up PET-CT and biopsy should be considered. There are slightly enlarged lymph nodes in the mediastinum largest measuring 1.5 x 1 cm in the aortopulmonary window. This may suggest incidental reactive hyperplasia of nodes or metastatic disease. There is low-attenuation in the body of L1 vertebra with coarse trabeculae suggesting possible hemangioma. Less likely possibility would be skeletal metastatic disease. This finding could also be evaluated in the PET-CT. There is 2.8 cm nodule in the left adrenal, possibly adenoma. This finding could also be evaluated in the PET-CT. 0 Fatty liver. Gallbladder stones. There is moderate sized hiatal hernia. Coronary artery calcifications are seen. Other findings as described in the body of the report. Electronically Signed   By: Elmer Picker M.D.   On:  06/11/2021 13:47     Scheduled Meds:  allopurinol  300 mg Oral Daily   atorvastatin  10 mg Oral QHS   feeding supplement  237 mL Oral BID BM   insulin aspart  0-9 Units Subcutaneous TID WC   multivitamin with minerals  1 tablet Oral Daily   pantoprazole (PROTONIX) IV  40 mg Intravenous Q12H   Continuous Infusions:  sodium chloride       LOS: 0 days    Roxan Hockey M.D on 06/12/2021 at 6:19 PM  Go to [email or web address] - for contact info  [facility] - Office  [phone]  If 7PM-7AM, please contact night-coverage [email or web address] Password [facility] 06/12/2021, 6:19 PM

## 2021-06-12 NOTE — Evaluation (Signed)
Physical Therapy Evaluation Patient Details Name: Ronnie Lawrence MRN: [id number] DOB: [birth year] Today's Date: 06/12/2021  History of Present Illness  Ronnie Lawrence is a 88 y.o. male with medical history significant of T2DM, HTN, hx of CVA, gout, atrial flutter on xarelto who presented to Ed for feeling tired and weak for a couple weeks. He has had poor PO intake for 2+ weeks and has had an upset stomach.  He states his speech started to slur about one week ago, but has gotten progressive worse and he may have had some trouble with his right leg. He denies any other focal deficits, confusion or weakness. He didn't want  to come to ER and family firmly suggested he come to ED today as they were concerned for stroke.   Clinical Impression  Patient is modified independent with supine to sit bed mobility demonstrating increased time needed to complete task along with need of UE/trunk musculature momentum observed during assessment. Patient is modified independent with sit to stand and bed to chair transfers but performs task with slightly labored movement with forward trunk lean and notable balance deficits. Patient was able to transfer from chair to commode with supervision and min guard assist indicating good return to function. Patient was able to ambulate for 50 feet with min guard assist and no assistive device but was limited by fatigue and generalized weakness. Minor balance deficits and gait deviations observed, but generally close to WNL or expected with current presentation. Patient will benefit from continued skilled physical therapy in hospital and recommended venue below to increase strength, balance, endurance for safe ADLs and gait.      Recommendations for follow up therapy are one component of a multi-disciplinary discharge planning process, led by the attending physician.  Recommendations may be updated based on patient status, additional functional criteria and insurance  authorization.  Follow Up Recommendations Home health PT    Assistance Recommended at Discharge PRN  Patient can return home with the following  A little help with walking and/or transfers;Assistance with cooking/housework;Help with stairs or ramp for entrance    Equipment Recommendations None recommended by PT  Recommendations for Other Services       Functional Status Assessment Patient has had a recent decline in their functional status and demonstrates the ability to make significant improvements in function in a reasonable and predictable amount of time.     Precautions / Restrictions Precautions Precautions: None Restrictions Weight Bearing Restrictions: No      Mobility  Bed Mobility Overal bed mobility: Modified Independent             General bed mobility comments: Modified independent with supine to sit with use of UE and trunk musculature momentum to complete task.    Transfers Overall transfer level: Modified independent                 General transfer comment: Patient modified independent with ability to transfer from bed to chair with forward trunk lean and supervision required. Patient was able to transfer from chair to commode with supervision and min guard assist w/ gait belt.    Ambulation/Gait Ambulation/Gait assistance: Supervision, Min guard Gait Distance (Feet): 50 Feet Assistive device: None Gait Pattern/deviations: Decreased step length - right, Decreased step length - left, Decreased stride length, Narrow base of support Gait velocity: decreased     General Gait Details: Patient was able to ambulate for 50 feet with supervision and min guard assist with gait belt. Patient demonstrated deviated  gait pattern of narrow stance along with decreased step/stride length impacting ambulation ability. Patient was primarily limited by fatigue and generalized weakness.  Stairs            Wheelchair Mobility    Modified Rankin (Stroke  Patients Only)       Balance Overall balance assessment: Needs assistance Sitting-balance support: Bilateral upper extremity supported, Feet supported Sitting balance-Leahy Scale: Good Sitting balance - Comments: Patient able to sit EOB with ability to support himself in upright position.   Standing balance support: No upper extremity supported, During functional activity Standing balance-Leahy Scale: Fair Standing balance comment: Patient demonstrates mild balance deficits once UE/LE reaches outside BOS.                             Pertinent Vitals/Pain Pain Assessment Pain Assessment: No/denies pain    Home Living Family/patient expects to be discharged to:: Private residence Living Arrangements: Alone Available Help at Discharge: Family;Available PRN/intermittently Type of Home: House Home Access: Level entry       Home Layout: One level Home Equipment: Rolling Walker (2 wheels);Grab bars - tub/shower;Shower seat - built in;Other (comment) Additional Comments: Pt reports daughter lives within walking distace.    Prior Function Prior Level of Function : Independent/Modified Independent             Mobility Comments: Household ambulator with RW PRN. Walking stick used when ambulating outside. ADLs Comments: Patient reports being independent with most ADL's with assistance from daughters for groceries and cooking prn.     Hand Dominance   Dominant Hand: Right    Extremity/Trunk Assessment   Upper Extremity Assessment Upper Extremity Assessment: Defer to OT evaluation    Lower Extremity Assessment Lower Extremity Assessment: Generalized weakness    Cervical / Trunk Assessment Cervical / Trunk Assessment: Normal  Communication   Communication: HOH  Cognition Arousal/Alertness: Awake/alert Behavior During Therapy: WFL for tasks assessed/performed Overall Cognitive Status: Within Functional Limits for tasks assessed                                           General Comments      Exercises     Assessment/Plan    PT Assessment Patient needs continued PT services;All further PT needs can be met in the next venue of care  PT Problem List Decreased strength;Decreased activity tolerance;Decreased balance;Decreased mobility;Decreased coordination       PT Treatment Interventions DME instruction;Gait training;Functional mobility training;Therapeutic activities;Therapeutic exercise;Stair training;Balance training    PT Goals (Current goals can be found in the Care Plan section)  Acute Rehab PT Goals Patient Stated Goal: return home PT Goal Formulation: With patient Time For Goal Achievement: 06/26/21 Potential to Achieve Goals: Good    Frequency Min 3X/week     Co-evaluation   Reason for Co-Treatment: To address functional/ADL transfers   OT goals addressed during session: ADL's and self-care       AM-PAC PT "6 Clicks" Mobility  Outcome Measure Help needed turning from your back to your side while in a flat bed without using bedrails?: None Help needed moving from lying on your back to sitting on the side of a flat bed without using bedrails?: None Help needed moving to and from a bed to a chair (including a wheelchair)?: A Little Help needed standing up from a chair using  your arms (e.g., wheelchair or bedside chair)?: A Little Help needed to walk in hospital room?: A Little Help needed climbing 3-5 steps with a railing? : A Lot 6 Click Score: 19    End of Session Equipment Utilized During Treatment: Gait belt Activity Tolerance: Patient tolerated treatment well;Patient limited by fatigue;No increased pain Patient left: in chair;with call bell/phone within reach Nurse Communication: Mobility status PT Visit Diagnosis: Unsteadiness on feet (R26.81);Other abnormalities of gait and mobility (R26.89);Muscle weakness (generalized) (M62.81)    Time: 1994-1290 PT Time Calculation (min) (ACUTE ONLY):  30 min   Charges:   PT Evaluation $PT Eval Moderate Complexity: 1 Mod PT Treatments $Therapeutic Activity: 23-37 mins        10:30 AM, 06/12/21 Lestine Box, S/PT

## 2021-06-13 ENCOUNTER — Other Ambulatory Visit: Payer: Self-pay

## 2021-06-13 ENCOUNTER — Encounter (HOSPITAL_COMMUNITY)
Admission: EM | Disposition: A | Discharge status: Home / Self Care | Payer: Self-pay | Source: Home / Self Care | Attending: Family Medicine

## 2021-06-13 ENCOUNTER — Inpatient Hospital Stay (HOSPITAL_COMMUNITY): Payer: Medicare Other | Admitting: Certified Registered"

## 2021-06-13 ENCOUNTER — Encounter (HOSPITAL_COMMUNITY): Payer: Self-pay | Admitting: Family Medicine

## 2021-06-13 DIAGNOSIS — K8020 Calculus of gallbladder without cholecystitis without obstruction: Secondary | ICD-10-CM | POA: Diagnosis present

## 2021-06-13 DIAGNOSIS — K921 Melena: Secondary | ICD-10-CM | POA: Diagnosis present

## 2021-06-13 DIAGNOSIS — K264 Chronic or unspecified duodenal ulcer with hemorrhage: Secondary | ICD-10-CM | POA: Diagnosis present

## 2021-06-13 DIAGNOSIS — R4701 Aphasia: Secondary | ICD-10-CM | POA: Diagnosis present

## 2021-06-13 DIAGNOSIS — K2970 Gastritis, unspecified, without bleeding: Secondary | ICD-10-CM | POA: Diagnosis not present

## 2021-06-13 DIAGNOSIS — I4891 Unspecified atrial fibrillation: Secondary | ICD-10-CM | POA: Diagnosis present

## 2021-06-13 DIAGNOSIS — R630 Anorexia: Secondary | ICD-10-CM | POA: Diagnosis present

## 2021-06-13 DIAGNOSIS — K449 Diaphragmatic hernia without obstruction or gangrene: Secondary | ICD-10-CM

## 2021-06-13 DIAGNOSIS — K222 Esophageal obstruction: Secondary | ICD-10-CM | POA: Diagnosis present

## 2021-06-13 DIAGNOSIS — D500 Iron deficiency anemia secondary to blood loss (chronic): Secondary | ICD-10-CM | POA: Diagnosis present

## 2021-06-13 DIAGNOSIS — R599 Enlarged lymph nodes, unspecified: Secondary | ICD-10-CM | POA: Diagnosis present

## 2021-06-13 DIAGNOSIS — Z515 Encounter for palliative care: Secondary | ICD-10-CM | POA: Diagnosis not present

## 2021-06-13 DIAGNOSIS — K644 Residual hemorrhoidal skin tags: Secondary | ICD-10-CM | POA: Diagnosis not present

## 2021-06-13 DIAGNOSIS — J45909 Unspecified asthma, uncomplicated: Secondary | ICD-10-CM | POA: Diagnosis present

## 2021-06-13 DIAGNOSIS — R634 Abnormal weight loss: Secondary | ICD-10-CM

## 2021-06-13 DIAGNOSIS — Z66 Do not resuscitate: Secondary | ICD-10-CM | POA: Diagnosis present

## 2021-06-13 DIAGNOSIS — I4892 Unspecified atrial flutter: Secondary | ICD-10-CM | POA: Diagnosis present

## 2021-06-13 DIAGNOSIS — Z20822 Contact with and (suspected) exposure to covid-19: Secondary | ICD-10-CM | POA: Diagnosis present

## 2021-06-13 DIAGNOSIS — K2960 Other gastritis without bleeding: Secondary | ICD-10-CM | POA: Diagnosis not present

## 2021-06-13 DIAGNOSIS — D62 Acute posthemorrhagic anemia: Secondary | ICD-10-CM | POA: Diagnosis not present

## 2021-06-13 DIAGNOSIS — K5730 Diverticulosis of large intestine without perforation or abscess without bleeding: Secondary | ICD-10-CM | POA: Diagnosis not present

## 2021-06-13 DIAGNOSIS — Z961 Presence of intraocular lens: Secondary | ICD-10-CM | POA: Diagnosis present

## 2021-06-13 DIAGNOSIS — G4700 Insomnia, unspecified: Secondary | ICD-10-CM | POA: Diagnosis present

## 2021-06-13 DIAGNOSIS — D471 Chronic myeloproliferative disease: Secondary | ICD-10-CM | POA: Diagnosis present

## 2021-06-13 DIAGNOSIS — R627 Adult failure to thrive: Secondary | ICD-10-CM | POA: Diagnosis present

## 2021-06-13 DIAGNOSIS — E785 Hyperlipidemia, unspecified: Secondary | ICD-10-CM | POA: Diagnosis present

## 2021-06-13 DIAGNOSIS — K269 Duodenal ulcer, unspecified as acute or chronic, without hemorrhage or perforation: Secondary | ICD-10-CM

## 2021-06-13 DIAGNOSIS — K629 Disease of anus and rectum, unspecified: Secondary | ICD-10-CM | POA: Diagnosis not present

## 2021-06-13 DIAGNOSIS — E119 Type 2 diabetes mellitus without complications: Secondary | ICD-10-CM | POA: Diagnosis present

## 2021-06-13 DIAGNOSIS — D649 Anemia, unspecified: Secondary | ICD-10-CM | POA: Diagnosis not present

## 2021-06-13 DIAGNOSIS — M109 Gout, unspecified: Secondary | ICD-10-CM | POA: Diagnosis present

## 2021-06-13 DIAGNOSIS — C3490 Malignant neoplasm of unspecified part of unspecified bronchus or lung: Secondary | ICD-10-CM | POA: Diagnosis present

## 2021-06-13 DIAGNOSIS — I10 Essential (primary) hypertension: Secondary | ICD-10-CM | POA: Diagnosis present

## 2021-06-13 HISTORY — PX: COLONOSCOPY WITH PROPOFOL: SHX5780

## 2021-06-13 HISTORY — PX: ESOPHAGOGASTRODUODENOSCOPY (EGD) WITH PROPOFOL: SHX5813

## 2021-06-13 HISTORY — PX: BIOPSY: SHX5522

## 2021-06-13 HISTORY — PX: ESOPHAGEAL DILATION: SHX303

## 2021-06-13 LAB — GLUCOSE, CAPILLARY
Glucose-Capillary: 98 mg/dL (ref 70–99)
Glucose-Capillary: 152 mg/dL — ABNORMAL HIGH (ref 70–99)

## 2021-06-13 SURGERY — ESOPHAGOGASTRODUODENOSCOPY (EGD) WITH PROPOFOL
Anesthesia: General

## 2021-06-13 MED ORDER — LIDOCAINE VISCOUS HCL 2 % MT SOLN: 10.0000 mL | Freq: Once | OROMUCOSAL | Status: DC

## 2021-06-13 MED ORDER — PROPOFOL 10 MG/ML IV BOLUS
INTRAVENOUS | Status: DC | PRN
  Administered 2021-06-13: 50 mg via INTRAVENOUS

## 2021-06-13 MED ORDER — PHENYLEPHRINE HCL (PRESSORS) 10 MG/ML IV SOLN
INTRAVENOUS | Status: DC | PRN
  Administered 2021-06-13 (×3): 100 ug via INTRAVENOUS

## 2021-06-13 MED ORDER — SODIUM CHLORIDE 0.9 % IV SOLN: INTRAVENOUS | Status: DC

## 2021-06-13 MED ORDER — PROPOFOL 500 MG/50ML IV EMUL
INTRAVENOUS | Status: DC | PRN
  Administered 2021-06-13: 150 ug/kg/min via INTRAVENOUS

## 2021-06-13 MED ORDER — LIDOCAINE VISCOUS HCL 2 % MT SOLN
OROMUCOSAL | Status: AC
  Administered 2021-06-13: 15 mL
  Filled 2021-06-13: qty 15

## 2021-06-13 MED ORDER — DEXMEDETOMIDINE HCL IN NACL 80 MCG/20ML IV SOLN
INTRAVENOUS | Status: AC
  Filled 2021-06-13: qty 20

## 2021-06-13 MED ORDER — LACTATED RINGERS IV SOLN
INTRAVENOUS | Status: DC
  Administered 2021-06-13: 1000 mL via INTRAVENOUS

## 2021-06-13 MED ORDER — PROPOFOL 10 MG/ML IV BOLUS
INTRAVENOUS | Status: AC
Start: 2021-06-13 — End: ?
  Filled 2021-06-13: qty 20

## 2021-06-13 NOTE — Progress Notes (Signed)
PROGRESS NOTE     Ronnie Lawrence, is a 88 y.o. male, DOB - 1927-12-09, JSH:[id number]  Admit date - 06/11/2021   Admitting Physician Ronnie Flaming, MD  Outpatient Primary MD for the patient is Ronnie Noble, MD  LOS - 0  Chief Complaint  Patient presents with   Aphasia        Brief Narrative:   88 y.o. male with medical history significant of T2DM, HTN, hx of CVA, gout, atrial flutter on xarelto admitted on 06/11/2021 with a 5 g drop in hemoglobin and concerns for GI bleed with heme positive stools as well as increasing weakness concerning for speech and possible stroke symptoms    Subjective : The patient was seen and examined this morning, stable no acute distress, n.p.o. for further GI work-up this morning No issues overnight  assessment and Plan: 1)Symptomatic acute anemia with GI bleed -suspect acute blood loss anemia in setting of presumed GI bleed Heme positive stools  -On admission hemoglobin 9.7>>> 11.1 with 1 unit PRBC >>> (Hemoglobin baseline 15)  -Patient agreeable to EGD/ colonoscopy planned for 06/13/2021 -GI following closely -PTA patient was taking colchicine -Continue PPI -Continue to hold Xarelto  Stroke like symptoms in a patient with history of strokes -Stable, no residual findings -Speech concerns improving spontaneously -Unable to do MRI due to pacemaker in situ -Patient initially refused neuroimaging but later agreed to CT head and CTA head and neck--which failed to demonstrate any new acute strokes or LVO -Neurology input appreciated -Xarelto on hold >>> neurology recommending Eliquis -Continue Lipitor  Lung mass considered malignant until proven otherwise Already followed by oncology for his myeloproliferative disorder.  -Outpatient oncology follow-up for possible PET scan if patient desires advised -CTA head and neck without evidence of metastasis -Palliative care input appreciated  Failure to thrive in adult/Anorexia In setting of likely  malignancy Dietitian  consult appreciated -Continue nutritional supplements and multivitamin -Give Remeron for appetite stimulation and insomnia  DM2- A1c 6.3 reflecting excellent diabetic control PTA  -hold metformin due to  contrast exposure and n.p.o. status Use Novolog/Humalog Sliding scale insulin with Accu-Cheks/Fingersticks as ordered    Atrioventricular block, Mobitz type 1, Wenckebach s/p PPM  Followed by Dr. Quentin Lawrence He had a pacemaker implanted October 11, 2019. AF also has been detected on device interrogation  -  Atrial flutter /atrial fibrillation Noted on PPM interrogation in 01/2020 and started on xarelto given CHA2DS2-VAsc of 4 Holding in setting of GI bleed >>>  If okay with GI service after endoluminal evaluation patient will need to be restarted on Eliquis rather than Xarelto upon discharge due to slightly reduced risk of bleeding with the former compared to the later  Myeloproliferative disorder (Ronnie Lawrence) CALR positive Followed by oncology.  New onset anemia -Patient recently has persistent leukocytosis - Gout Continue allopurinol. Hold colchicine with GI bleed. Appears he was taking this daily  PTA  Social/ethics--- plan of care and advanced directive discussed with patient and 2 daughters at bedside Ronnie Lawrence and Ronnie Lawrence) -Questions answered -DNR/DNI -Patient was initially reluctant to have GI and neuro evaluation, after talking to his daughter's patient is requesting GI and neuro evaluation  Disposition/Need for in-Hospital Stay- patient unable to be discharged at this time due to --presumed GI bleed with symptomatic anemia requiring further endoluminal evaluation -Possible discharge home in 24 to 48 hours if GI bleed stabilizes  Disposition: The patient is from: Home              Anticipated d/c is to: Home  Anticipated d/c date is: 1 day              Patient currently is not medically stable to d/c. Barriers: Not Clinically Stable-   Code  Status :  -  Code Status: DNR   Family Communication:    (patient is alert, awake and coherent) -discussed with patient's 2 daughters at bedside Ronnie Lawrence and Ronnie Lawrence)  DVT Prophylaxis  :   - SCDs   SCD's Start: 06/11/21 1837   Lab Results  Component Value Date   PLT 427 (H) 06/12/2021    Inpatient Medications  Scheduled Meds:  allopurinol  300 mg Oral Daily   atorvastatin  10 mg Oral QHS   feeding supplement  237 mL Oral BID BM   insulin aspart  0-9 Units Subcutaneous TID WC   mirtazapine  7.5 mg Oral QHS   multivitamin with minerals  1 tablet Oral Daily   pantoprazole (PROTONIX) IV  40 mg Intravenous Q12H   Continuous Infusions:  sodium chloride     sodium chloride 20 mL/hr at 06/13/21 0830   PRN Meds:.acetaminophen **OR** acetaminophen (TYLENOL) oral liquid 160 mg/5 mL **OR** acetaminophen   Anti-infectives (From admission, onward)    None         Subjective: Ronnie Lawrence today has no fevers, no emesis,  No chest pain,   -Denies shortness of breath or palpitations - Patient states fatigue, weakness and dizziness improving slowly after transfusion of PRBC -Patient's 2 daughters Ronnie Lawrence and Ronnie Lawrence at bedside questions answered  Objective: Vitals:   06/12/21 1109 06/12/21 1257 06/12/21 2115 06/13/21 0433  BP: (!) 126/58 (!) 114/51 (!) 122/59 124/67  Pulse: 77 73 82 87  Resp: 20 20 16 16   Temp: 98 F (36.7 C) 98.8 F (37.1 C) (!) 97.5 F (36.4 C) (!) 97.3 F (36.3 C)  TempSrc:  Oral  Oral  SpO2: 93% 97% 97% 95%  Weight:      Height:        Intake/Output Summary (Last 24 hours) at 06/13/2021 1056 Last data filed at 06/13/2021 0811 Gross per 24 hour  Intake 390 ml  Output --  Net 390 ml   Filed Weights   06/11/21 1118  Weight: 73.5 kg     Physical Exam:   General:  AAO x 3,  cooperative, no distress;   HEENT:  Normocephalic, PERRL, otherwise with in Normal limits   Neuro:  CNII-XII intact. , normal motor and sensation, reflexes intact    Lungs:   Clear to auscultation BL, Respirations unlabored,  No wheezes / crackles  Cardio:    S1/S2, RRR, No murmure, No Rubs or Gallops   Abdomen:  Soft, non-tender, bowel sounds active all four quadrants, no guarding or peritoneal signs.  Muscular  skeletal:  Limited exam -global generalized weaknesses - in bed, able to move all 4 extremities,   2+ pulses,  symmetric, No pitting edema  Skin:  Dry, warm to touch, negative for any Rashes,  Wounds: Please see nursing documentation         Data Reviewed: I have personally reviewed following labs and imaging studies  CBC: Recent Labs  Lab 06/11/21 1140 06/11/21 2219 06/12/21 0130 06/12/21 1030  WBC 34.5* 33.6* 31.4* 33.9*  NEUTROABS 29.3*  --   --   --   HGB 10.7* 10.0* 9.7* 11.1*  HCT 34.0* 32.3* 31.5* 34.8*  MCV 95.2 95.3 94.9 94.8  PLT 454* 376 365 417*   Basic Metabolic Panel: Recent Labs  Lab 06/11/21  1140 06/12/21 0130  NA 136 138  K 4.3 4.2  CL 105 108  CO2 26 25  GLUCOSE 141* 100*  BUN 27* 19  CREATININE 1.26* 1.05  CALCIUM 8.9 8.4*  MG 2.1  --    GFR: Estimated Creatinine Clearance: 47.3 mL/min (by C-G formula based on SCr of 1.05 mg/dL). Liver Function Tests: Recent Labs  Lab 06/11/21 1140  AST 39  ALT 28  ALKPHOS 142*  BILITOT 0.5  PROT 6.8  ALBUMIN 3.4*   Cardiac Enzymes: No results for input(s): CKTOTAL, CKMB, CKMBINDEX, TROPONINI in the last 168 hours. BNP (last 3 results) No results for input(s): PROBNP in the last 8760 hours. HbA1C: Recent Labs    06/11/21 2219  HGBA1C 6.3*   Sepsis Labs: @LABRCNTIP (procalcitonin:4,lacticidven:4) ) Recent Results (from the past 240 hour(s))  Resp Panel by RT-PCR (Flu A&B, Covid) Nasopharyngeal Swab     Status: None   Collection Time: 06/11/21 12:38 PM   Specimen: Nasopharyngeal Swab; Nasopharyngeal(NP) swabs in vial transport medium  Result Value Ref Range Status   SARS Coronavirus 2 by RT PCR NEGATIVE NEGATIVE Final    Comment:  (NOTE) SARS-CoV-2 target nucleic acids are NOT DETECTED.  The SARS-CoV-2 RNA is generally detectable in upper respiratory specimens during the acute phase of infection. The lowest concentration of SARS-CoV-2 viral copies this assay can detect is 138 copies/mL. A negative result does not preclude SARS-Cov-2 infection and should not be used as the sole basis for treatment or other patient management decisions. A negative result may occur with  improper specimen collection/handling, submission of specimen other than nasopharyngeal swab, presence of viral mutation(s) within the areas targeted by this assay, and inadequate number of viral copies(<138 copies/mL). A negative result must be combined with clinical observations, patient history, and epidemiological information. The expected result is Negative.  Fact Sheet for Patients:  EntrepreneurPulse.com.au  Fact Sheet for Healthcare Providers:  IncredibleEmployment.be  This test is no t yet approved or cleared by the [place] FDA and  has been authorized for detection and/or diagnosis of SARS-CoV-2 by FDA under an Emergency Use Authorization (EUA). This EUA will remain  in effect (meaning this test can be used) for the duration of the COVID-19 declaration under Section 564(b)(1) of the Act, 21 U.S.C.section 360bbb-3(b)(1), unless the authorization is terminated  or revoked sooner.       Influenza A by PCR NEGATIVE NEGATIVE Final   Influenza B by PCR NEGATIVE NEGATIVE Final    Comment: (NOTE) The Xpert Xpress SARS-CoV-2/FLU/RSV plus assay is intended as an aid in the diagnosis of influenza from Nasopharyngeal swab specimens and should not be used as a sole basis for treatment. Nasal washings and aspirates are unacceptable for Xpert Xpress SARS-CoV-2/FLU/RSV testing.  Fact Sheet for Patients: EntrepreneurPulse.com.au  Fact Sheet for Healthcare  Providers: IncredibleEmployment.be  This test is not yet approved or cleared by the [place] FDA and has been authorized for detection and/or diagnosis of SARS-CoV-2 by FDA under an Emergency Use Authorization (EUA). This EUA will remain in effect (meaning this test can be used) for the duration of the COVID-19 declaration under Section 564(b)(1) of the Act, 21 U.S.C. section 360bbb-3(b)(1), unless the authorization is terminated or revoked.  Performed at [facility], [address], [place], [place] [address]   Blood culture (routine x 2)     Status: None (Preliminary result)   Collection Time: 06/11/21 12:41 PM   Specimen: BLOOD RIGHT FOREARM  Result Value Ref Range Status   Specimen  Description BLOOD RIGHT FOREARM  Final   Special Requests   Final    BOTTLES DRAWN AEROBIC AND ANAEROBIC Blood Culture results may not be optimal due to an excessive volume of blood received in culture bottles   Culture   Final    NO GROWTH 2 DAYS Performed at [facility], [address] [place], [place] [address]    Report Status PENDING  Incomplete  Blood culture (routine x 2)     Status: None (Preliminary result)   Collection Time: 06/11/21 12:42 PM   Specimen: BLOOD LEFT FOREARM  Result Value Ref Range Status   Specimen Description BLOOD LEFT FOREARM  Final   Special Requests   Final    BOTTLES DRAWN AEROBIC AND ANAEROBIC Blood Culture adequate volume   Culture   Final    NO GROWTH 2 DAYS Performed at [facility], [address] [place], [place] [address]    Report Status PENDING  Incomplete      Radiology Studies: CT ANGIO HEAD NECK W WO CM  Result Date: 06/12/2021 CLINICAL DATA:  Stroke.  Slurred speech and unsteady gait. EXAM: CT ANGIOGRAPHY HEAD AND NECK TECHNIQUE: Multidetector CT imaging of the head and neck was performed using the standard protocol during bolus administration of intravenous contrast. Multiplanar CT image reconstructions and MIPs were  obtained to evaluate the vascular anatomy. Carotid stenosis measurements (when applicable) are obtained utilizing NASCET criteria, using the distal internal carotid diameter as the denominator. RADIATION DOSE REDUCTION: This exam was performed according to the departmental dose-optimization program which includes automated exposure control, adjustment of the mA and/or kV according to patient size and/or use of iterative reconstruction technique. CONTRAST:  70mL OMNIPAQUE IOHEXOL 350 MG/ML SOLN COMPARISON:  CT head 06/11/2021 FINDINGS: CT HEAD FINDINGS Brain: Generalized atrophy. Negative for hydrocephalus. Chronic infarct right parietal lobe unchanged. Chronic microvascular ischemic change in the white matter stable. Negative for acute infarct, hemorrhage, mass Vascular: Negative for hyperdense vessel Skull: Negative Sinuses: Negative Orbits: Bilateral cataract extraction Review of the MIP images confirms the above findings CTA NECK FINDINGS Aortic arch: Atherosclerotic calcification aortic arch and proximal great vessels. No significant stenosis or dissection. Left vertebral artery origin from the arch. Right carotid system: Atherosclerotic calcification right carotid bifurcation. Negative for stenosis. Left carotid system: Atherosclerotic calcification left common carotid artery, mild. Atherosclerotic calcification left carotid bifurcation without stenosis. Vertebral arteries: Both vertebral arteries patent to the skull base without stenosis. Skeleton: Cervical kyphosis and spondylosis. No acute skeletal finding. Other neck: Negative for mass or adenopathy in the neck. Upper chest: Apical emphysema.  Left-sided pacemaker. Review of the MIP images confirms the above findings CTA HEAD FINDINGS Anterior circulation: Atherosclerotic calcifications at the cavernous carotid bilaterally without significant stenosis. Anterior and middle cerebral arteries patent without stenosis or vascular malformation Posterior  circulation: Both vertebral arteries patent to the basilar. Left PICA patent. Right PICA not visualized. Basilar widely patent. AICA, superior cerebellar, posterior cerebral arteries patent without stenosis or vascular malformation. Venous sinuses: Normal venous enhancement. Anatomic variants: None Review of the MIP images confirms the above findings IMPRESSION: CT head negative for acute abnormality. Atrophy and chronic ischemic changes stable from yesterday Negative for intracranial large vessel occlusion Atherosclerotic disease in the aortic arch and carotid bifurcation bilaterally without significant stenosis. Electronically Signed   By: Franchot Gallo M.D.   On: 06/12/2021 15:09   DG Chest 1 View  Result Date: 06/11/2021 CLINICAL DATA:  Generalized weakness, slurred speech EXAM: CHEST  1 VIEW COMPARISON:  10/11/2019  FINDINGS: Cardiac size is within normal limits. There are no signs of pulmonary edema. There is 3.8 cm homogeneous nodule with slightly spiculated margins in the left parahilar region. Low position of diaphragms may suggest COPD. Linear densities in the left lower lung fields have not changed suggesting scarring. Pacemaker battery is seen in the left infraclavicular region. IMPRESSION: There is 3.8 cm homogeneous opacity in the left parahilar region suggesting possible malignant neoplasm. Less likely possibility would be pneumonia. Follow-up CT chest may be considered. Electronically Signed   By: Elmer Picker M.D.   On: 06/11/2021 12:05   CT HEAD WO CONTRAST  Result Date: 06/11/2021 CLINICAL DATA:  Neuro deficit, acute, stroke suspected EXAM: CT HEAD WITHOUT CONTRAST TECHNIQUE: Contiguous axial images were obtained from the base of the skull through the vertex without intravenous contrast. RADIATION DOSE REDUCTION: This exam was performed according to the departmental dose-optimization program which includes automated exposure control, adjustment of the mA and/or kV according to  patient size and/or use of iterative reconstruction technique. COMPARISON:  CT head November 03, 2020. FINDINGS: Brain: Increased size/conspicuity of and area of hypodensity in the right parietal lobe. No acute hemorrhage, mass lesion, midline shift, hydrocephalus. Cerebral atrophy. Chronic microvascular disease. Vascular: No hyperdense vessel identified. Skull: No acute fracture. Sinuses/Orbits: Left maxillary sinus mucosal thickening. No acute orbital findings. Other: No mastoid effusions. IMPRESSION: Increased size/conspicuity of an area of hypodensity in the right parietal lobe, potentially evolution of previously seen infarct or acute on chronic infarct. If there is concern for acute infarct, recommend MRI. Electronically Signed   By: Margaretha Sheffield M.D.   On: 06/11/2021 12:00   CT Angio Chest PE W and/or Wo Contrast  Result Date: 06/11/2021 CLINICAL DATA:  Abnormal chest radiographs, high clinical suspicion for pulmonary embolism EXAM: CT ANGIOGRAPHY CHEST WITH CONTRAST TECHNIQUE: Multidetector CT imaging of the chest was performed using the standard protocol during bolus administration of intravenous contrast. Multiplanar CT image reconstructions and MIPs were obtained to evaluate the vascular anatomy. RADIATION DOSE REDUCTION: This exam was performed according to the departmental dose-optimization program which includes automated exposure control, adjustment of the mA and/or kV according to patient size and/or use of iterative reconstruction technique. CONTRAST:  59mL OMNIPAQUE IOHEXOL 350 MG/ML SOLN COMPARISON:  Previous studies including the chest radiographs done earlier today FINDINGS: Cardiovascular: Heart is enlarged in size. Pacemaker leads are noted in place. Coronary artery calcifications are seen. There is homogeneous enhancement in thoracic aorta. There is ectasia of ascending thoracic aorta measuring 3.8 cm. There are calcifications and atherosclerotic plaques in the thoracic aorta. There  are no intraluminal filling defects in the pulmonary artery branches. Mediastinum/Nodes: There are few slightly enlarged lymph nodes in mediastinum and hilar regions largest measuring 1.5 x 1 cm in the aortopulmonary window. Lungs/Pleura: There is 4.7 x 3.8 cm noncalcified mass in the anterior left mid lung fields. Margins are slightly spiculated. This lesion is extending to the pleura with pleural thickening. There is narrowing of traversing pulmonary vascular branches within the lesion. Centrilobular emphysema is seen. Increased interstitial markings are seen in the periphery of both lungs, more so in the lower lung fields. There are multiple blebs and bullae in the periphery of both lungs. There is no pleural effusion or pneumothorax. Upper Abdomen: There is fatty infiltration in the liver. Small calcific density in the gallbladder suggests gallbladder stones. There is moderate sized hiatal hernia. There is 2.8 cm nodule in the left adrenal. Density measurements are less than 10 Hounsfield units  suggesting possible adenoma. Musculoskeletal: Degenerative changes are noted in the thoracic spine and visualized lower cervical spine. There is low attenuation with coarse trabeculae in the body of L1 vertebra. This finding is not optimally evaluated. This may suggest benign process such as hemangioma or metastatic disease. Review of the MIP images confirms the above findings. IMPRESSION: There is 4.7 x 3.8 cm mass lesion with spiculated margins in the anterior left mid lung fields in the left upper lobe. The lesion is extending to the pleura with focal pleural thickening. This lesion has to be considered a malignant neoplasm until proven otherwise. Follow-up PET-CT and biopsy should be considered. There are slightly enlarged lymph nodes in the mediastinum largest measuring 1.5 x 1 cm in the aortopulmonary window. This may suggest incidental reactive hyperplasia of nodes or metastatic disease. There is low-attenuation in  the body of L1 vertebra with coarse trabeculae suggesting possible hemangioma. Less likely possibility would be skeletal metastatic disease. This finding could also be evaluated in the PET-CT. There is 2.8 cm nodule in the left adrenal, possibly adenoma. This finding could also be evaluated in the PET-CT. 0 Fatty liver. Gallbladder stones. There is moderate sized hiatal hernia. Coronary artery calcifications are seen. Other findings as described in the body of the report. Electronically Signed   By: Elmer Picker M.D.   On: 06/11/2021 13:47     Scheduled Meds:  allopurinol  300 mg Oral Daily   atorvastatin  10 mg Oral QHS   feeding supplement  237 mL Oral BID BM   insulin aspart  0-9 Units Subcutaneous TID WC   mirtazapine  7.5 mg Oral QHS   multivitamin with minerals  1 tablet Oral Daily   pantoprazole (PROTONIX) IV  40 mg Intravenous Q12H   Continuous Infusions:  sodium chloride     sodium chloride 20 mL/hr at 06/13/21 0830     LOS: 0 days    Deatra James M.D on 06/13/2021 at 10:56 AM  Go to [email or web address] - for contact info  [facility] - Office  [phone]  If 7PM-7AM, please contact night-coverage [email or web address] Password [facility] 06/13/2021, 10:56 AM

## 2021-06-13 NOTE — Op Note (Signed)
[facility] Patient Name: Ronnie Lawrence Procedure Date: 06/13/2021 2:35 PM MRN: [id number] Date of Birth: Sep 21, 1927 Attending MD: Hildred Laser , MD CSN: [id number] Age: 88 Admit Type: Outpatient Procedure:                Colonoscopy Indications:              Acute post hemorrhagic anemia Providers:                Hildred Laser, MD, Rosina Lowenstein, RN, Bonnetta Barry,                            Technician Referring MD:             Skipper Cliche, MD Medicines:                Propofol per Anesthesia Complications:            No immediate complications. Estimated Blood Loss:     Estimated blood loss: none. Procedure:                Pre-Anesthesia Assessment:                           - Prior to the procedure, a History and Physical                            was performed, and patient medications and                            allergies were reviewed. The patient's tolerance of                            previous anesthesia was also reviewed. The risks                            and benefits of the procedure and the sedation                            options and risks were discussed with the patient.                            All questions were answered, and informed consent                            was obtained. Prior Anticoagulants: The patient                            last took Xarelto (rivaroxaban) 3 days prior to the                            procedure. ASA Grade Assessment: IV - A patient                            with severe systemic disease that is a constant  threat to life. After reviewing the risks and                            benefits, the patient was deemed in satisfactory                            condition to undergo the procedure.                           After obtaining informed consent, the colonoscope                            was passed under direct vision. Throughout the                            procedure, the patient's  blood pressure, pulse, and                            oxygen saturations were monitored continuously. The                            [id number]-HQ190L ([id number]) scope was introduced through                            the anus and advanced to the the cecum, identified                            by appendiceal orifice and ileocecal valve. The                            colonoscopy was performed without difficulty. The                            patient tolerated the procedure well. The quality                            of the bowel preparation was adequate to identify                            polyps. The ileocecal valve, appendiceal orifice,                            and rectum were photographed. Scope In: 3:15:34 PM Scope Out: 3:25:42 PM Scope Withdrawal Time: 0 hours 6 minutes 16 seconds  Total Procedure Duration: 0 hours 10 minutes 8 seconds  Findings:      Skin tags were found on perianal exam.      Scattered diverticula were found in the entire colon.      The exam was otherwise normal throughout the examined colon.      The retroflexed view of the distal rectum and anal verge was normal and       showed no anal or rectal abnormalities. Impression:               - Perianal skin tags found on perianal exam.                           -  Diverticulosis in the entire examined colon.                           - No specimens collected. Moderate Sedation:      Per Anesthesia Care Recommendation:           - Mechanical soft diet today.                           - Continue present medications.                           - Resume Xarelto (rivaroxaban) at prior dose in 7                            days. Procedure Code(s):        --- Professional ---                           [phone], Colonoscopy, flexible; diagnostic, including                            collection of specimen(s) by brushing or washing,                            when performed (separate procedure) Diagnosis Code(s):        ---  Professional ---                           K64.4, Residual hemorrhoidal skin tags                           D62, Acute posthemorrhagic anemia                           K57.30, Diverticulosis of large intestine without                            perforation or abscess without bleeding CPT copyright 2019 American [employer] Association. All rights reserved. The codes documented in this report are preliminary and upon coder review may  be revised to meet current compliance requirements. Hildred Laser, MD Hildred Laser, MD 06/13/2021 3:43:58 PM This report has been signed electronically. Number of Addenda: 0

## 2021-06-13 NOTE — Progress Notes (Signed)
Brief EGD and colonoscopy notes  EGD  High-grade stricture at GE junction dilated to 15 mm with a balloon dilator. 2 cm sliding hiatal hernia. Erosive antral gastritis. 2 bulbar ulcers with moderate to severe duodenitis. Post bulbar duodenal mucosa normal.   Colonoscopy  Scattered diverticula throughout the colon otherwise unremarkable examination.

## 2021-06-13 NOTE — Progress Notes (Signed)
[facility] Hospital Liaison Note  Notified by TOC manager of patient/family request for [facility] palliative services at home after discharge.   [facility] hospital liaison will follow patient for discharge disposition.   Please call with any hospice or outpatient palliative care related questions.   Thank you for the opportunity to participate in this patient's care.   Shanita Wicker, LCSW [facility] Hospital Liaison [phone]  

## 2021-06-13 NOTE — Hospital Course (Signed)
88 y.o. male with medical history significant of T2DM, HTN, hx of CVA, gout, atrial flutter on xarelto admitted on 06/11/2021 with a 5 g drop in hemoglobin and concerns for GI bleed with heme positive stools as well as increasing weakness concerning for speech and possible stroke symptoms.

## 2021-06-13 NOTE — Anesthesia Postprocedure Evaluation (Signed)
Anesthesia Post Note  Patient: Ronnie Lawrence  Procedure(s) Performed: ESOPHAGOGASTRODUODENOSCOPY (EGD) WITH PROPOFOL COLONOSCOPY WITH PROPOFOL ESOPHAGEAL DILATION BIOPSY  Patient location during evaluation: PACU Anesthesia Type: General Level of consciousness: awake and alert and oriented Pain management: pain level controlled Vital Signs Assessment: post-procedure vital signs reviewed and stable Respiratory status: spontaneous breathing, nonlabored ventilation, respiratory function stable and patient connected to nasal cannula oxygen Cardiovascular status: blood pressure returned to baseline and stable Postop Assessment: no apparent nausea or vomiting Anesthetic complications: no   No notable events documented.   Last Vitals:  Vitals:   06/13/21 1533 06/13/21 1540  BP: (!) 111/54 (!) 118/56  Pulse: 73 69  Resp: 17 (!) 21  Temp: 36.6 C   SpO2: 99% 99%    Last Pain:  Vitals:   06/13/21 1533  TempSrc:   PainSc: Asleep                 Sapna Padron C Chavela Justiniano

## 2021-06-13 NOTE — Op Note (Signed)
[facility] Patient Name: Ronnie Lawrence Procedure Date: 06/13/2021 2:36 PM MRN: [id number] Date of Birth: 05/10/1927 Attending MD: Hildred Laser , MD CSN: [id number] Age: 88 Admit Type: Outpatient Procedure:                Upper GI endoscopy Indications:              Iron deficiency anemia secondary to chronic blood                            loss, Anorexia, Weight loss Providers:                Hildred Laser, MD, Rosina Lowenstein, RN, Bonnetta Barry,                            Technician Referring MD:             Skipper Cliche, MD Medicines:                Propofol per Anesthesia Complications:            No immediate complications. Estimated Blood Loss:     Estimated blood loss was minimal. Procedure:                Pre-Anesthesia Assessment:                           - Prior to the procedure, a History and Physical                            was performed, and patient medications and                            allergies were reviewed. The patient's tolerance of                            previous anesthesia was also reviewed. The risks                            and benefits of the procedure and the sedation                            options and risks were discussed with the patient.                            All questions were answered, and informed consent                            was obtained. Prior Anticoagulants: The patient                            last took Xarelto (rivaroxaban) 3 days prior to the                            procedure. ASA Grade Assessment: IV - A patient  with severe systemic disease that is a constant                            threat to life. After reviewing the risks and                            benefits, the patient was deemed in satisfactory                            condition to undergo the procedure.                           After obtaining informed consent, the endoscope was                            passed  under direct vision. Throughout the                            procedure, the patient's blood pressure, pulse, and                            oxygen saturations were monitored continuously. The                            [id number]-[id number] ([id number]) scope was introduced through the                            mouth, and advanced to the second part of duodenum.                            The upper GI endoscopy was accomplished without                            difficulty. The patient tolerated the procedure                            well. Scope In: 3:02:45 PM Scope Out: 3:11:14 PM Total Procedure Duration: 0 hours 8 minutes 29 seconds  Findings:      The hypopharynx was normal.      The examined esophagus was normal.      One benign-appearing, intrinsic moderate (circumferential scarring or       stenosis; an endoscope may pass) stenosis was found 43 cm from the       incisors. The stenosis was traversed. A TTS dilator was passed through       the scope. Dilation with a 15-16.5-18 mm balloon dilator was performed       to 15 mm. The dilation site was examined and showed moderate mucosal       disruption, moderate improvement in luminal narrowing and no perforation.      A 2 cm hiatal hernia was present.      Patchy mild inflammation characterized by congestion (edema) and       erosions was found in the gastric antrum. Biopsies were taken with a       cold forceps for histology.  Two non-bleeding cratered duodenal ulcers with no stigmata of bleeding       were found in the duodenal bulb. The largest lesion was 5 mm in largest       dimension.      The second portion of the duodenum was normal. Impression:               - Normal hypopharynx.                           - Normal esophagus.                           - Benign-appearing esophageal stenosis. Dilated.                           - 2 cm hiatal hernia.                           - Gastritis. Biopsied.                           -  Non-bleeding duodenal ulcers with no stigmata of                            bleeding.                           - Normal second portion of the duodenum. Moderate Sedation:      Per Anesthesia Care Recommendation:           - Mechanical soft diet today.                           - Continue present medications.                           - No aspirin, ibuprofen, naproxen, or other                            non-steroidal anti-inflammatory drugs.                           - Resume Xarelto (rivaroxaban) at prior dose in 7                            days.                           - Await pathology results. Procedure Code(s):        --- Professional ---                           [phone], Esophagogastroduodenoscopy, flexible,                            transoral; with transendoscopic balloon dilation of                            esophagus (less than  30 mm diameter) Diagnosis Code(s):        --- Professional ---                           K22.2, Esophageal obstruction                           K44.9, Diaphragmatic hernia without obstruction or                            gangrene                           K29.70, Gastritis, unspecified, without bleeding                           K26.9, Duodenal ulcer, unspecified as acute or                            chronic, without hemorrhage or perforation                           D50.0, Iron deficiency anemia secondary to blood                            loss (chronic)                           R63.0, Anorexia                           R63.4, Abnormal weight loss CPT copyright 2019 American Medical Association. All rights reserved. The codes documented in this report are preliminary and upon coder review may  be revised to meet current compliance requirements. Hildred Laser, MD Hildred Laser, MD 06/13/2021 3:39:28 PM This report has been signed electronically. Number of Addenda: 0

## 2021-06-13 NOTE — Progress Notes (Addendum)
Subjective:  Patient states he has lost close to 40 pounds in last few months.  He lives alone and he states he has had good appetite and has been eating well until recently.  Few weeks ago he did have loose stools with diarrhea resolved spontaneously.  No history of abdominal pain nausea vomiting melena or rectal bleeding.  He also denies chest pain shortness of breath or headache.  Current Medications:  Current Facility-Administered Medications:    [MAR Hold] 0.9 %  sodium chloride infusion, 10 mL/hr, Intravenous, Once, Gloris Manchester, MD   0.9 %  sodium chloride infusion, , Intravenous, Continuous, Clearance Coots Kristen S, PA-C, Last Rate: 20 mL/hr at 06/13/21 1333, Continued from Pre-op at 06/13/21 1333   [MAR Hold] acetaminophen (TYLENOL) tablet 650 mg, 650 mg, Oral, Q4H PRN **OR** [MAR Hold] acetaminophen (TYLENOL) 160 MG/5ML solution 650 mg, 650 mg, Per Tube, Q4H PRN **OR** [MAR Hold] acetaminophen (TYLENOL) suppository 650 mg, 650 mg, Rectal, Q4H PRN, Orland Mustard, MD   [facility] Hold] allopurinol (ZYLOPRIM) tablet 300 mg, 300 mg, Oral, Daily, Orland Mustard, MD, 300 mg at 06/12/21 1234   [MAR Hold] atorvastatin (LIPITOR) tablet 10 mg, 10 mg, Oral, QHS, Orland Mustard, MD, 10 mg at 06/12/21 2139   [facility] Hold] feeding supplement (ENSURE ENLIVE / ENSURE PLUS) liquid 237 mL, 237 mL, Oral, BID BM, Emokpae, Courage, MD   [MAR Hold] insulin aspart (novoLOG) injection 0-9 Units, 0-9 Units, Subcutaneous, TID WC, Orland Mustard, MD   lactated ringers infusion, , Intravenous, Continuous, Battula, Rajamani C, MD, Last Rate: 50 mL/hr at 06/13/21 1420, 1,000 mL at 06/13/21 1420   lidocaine (XYLOCAINE) 2 % viscous mouth solution 10 mL, 10 mL, Mouth/Throat, Once, Battula, Rajamani C, MD   [MAR Hold] mirtazapine (REMERON) tablet 7.5 mg, 7.5 mg, Oral, QHS, Emokpae, Courage, MD, 7.5 mg at 06/12/21 2139   [facility] Hold] multivitamin with minerals tablet 1 tablet, 1 tablet, Oral, Daily, Emokpae, Courage, MD, 1 tablet at  06/12/21 1748   [MAR Hold] pantoprazole (PROTONIX) injection 40 mg, 40 mg, Intravenous, Q12H, Gloris Manchester, MD, 40 mg at 06/12/21 2139  Objective: Blood pressure (!) 115/58, pulse 73, temperature 97.7 F (36.5 C), temperature source Oral, resp. rate 19, height 5\' 10"  (1.778 m), weight 73 kg, SpO2 95 %. Patient is alert and in no acute distress Conjunctiva is pink. Sclera is nonicteric Oropharyngeal mucosa is normal. No neck masses or thyromegaly noted. Pacemaker located in left pectoral region. Cardiac exam with regular rhythm normal S1 and S2. No murmur or gallop noted. Lungs are clear to auscultation. Abdomen is soft and nontender with organomegaly or masses. No LE edema or clubbing noted.  Labs/studies Results:      Latest Ref Rng & Units 06/12/2021   10:30 AM 06/12/2021    1:30 AM 06/11/2021   10:19 PM  CBC  WBC 4.0 - 10.5 K/uL 33.9   31.4   33.6    Hemoglobin 13.0 - 17.0 g/dL 06/13/2021   9.7   38.1    Hematocrit 39.0 - 52.0 % 34.8   31.5   32.3    Platelets 150 - 400 K/uL 427   365   376         Latest Ref Rng & Units 06/12/2021    1:30 AM 06/11/2021   11:40 AM 10/08/2019   10:12 AM  CMP  Glucose 70 - 99 mg/dL 10/10/2019   937   093    BUN 8 - 23 mg/dL 19   27  20    Creatinine 0.61 - 1.24 mg/dL 1.05   1.26   1.11    Sodium 135 - 145 mmol/L 138   136   139    Potassium 3.5 - 5.1 mmol/L 4.2   4.3   4.8    Chloride 98 - 111 mmol/L 108   105   104    CO2 22 - 32 mmol/L $RemoveB'25   26   26    'VpoGienG$ Calcium 8.9 - 10.3 mg/dL 8.4   8.9   9.5    Total Protein 6.5 - 8.1 g/dL  6.8     Total Bilirubin 0.3 - 1.2 mg/dL  0.5     Alkaline Phos 38 - 126 U/L  142     AST 15 - 41 U/L  39     ALT 0 - 44 U/L  28          Latest Ref Rng & Units 06/11/2021   11:40 AM  Hepatic Function  Total Protein 6.5 - 8.1 g/dL 6.8    Albumin 3.5 - 5.0 g/dL 3.4    AST 15 - 41 U/L 39    ALT 0 - 44 U/L 28    Alk Phosphatase 38 - 126 U/L 142    Total Bilirubin 0.3 - 1.2 mg/dL 0.5      Lab Results  Component Value Date    CRP 3.9 (H) 03/01/2021      Assessment:  #1.  Anemia.  Studies suggest iron deficiency anemia but he also may have anemia of chronic disease and he also has mild Prograft platelet disorder.  His stool is guaiac positive.  He has received 1 unit of PRBCs.  He is hemodynamically stable.  He did have adenoma removed from his colon several years ago and family history is positive for colon carcinoma in his mother who was in in her 58s at the time of diagnosis. Patient is stable for EGD and colonoscopy.  #2.  History of atrial flutter.  Last anticoagulant dose was 3 days ago.  #3.  History of lung mass  #4.  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   Plan:  Proceed with diagnostic esophagogastroduodenoscopy and colonoscopy. I have discussed both procedures with the patient and his daughters Lenna Sciara and Jenell Milliner and they are both agreeable.

## 2021-06-13 NOTE — Progress Notes (Signed)
Spoke with patient's daughter. They are agreeable to Palliative Services at home. Providers discussed. Referred made to [facility]. HHPT recommended. Referral made and accepted by Randall Hiss with [facility].

## 2021-06-13 NOTE — Progress Notes (Signed)
Physical Therapy Treatment Patient Details Name: Ronnie Lawrence MRN: [id number] DOB: 18-Apr-1927 Today's Date: 06/13/2021   History of Present Illness Ronnie Lawrence is a 88 y.o. male with medical history significant of T2DM, HTN, hx of CVA, gout, atrial flutter on xarelto who presented to Ed for feeling tired and weak for a couple weeks. He has had poor PO intake for 2+ weeks and has had an upset stomach.  He states his speech started to slur about one week ago, but has gotten progressive worse and he may have had some trouble with his right leg. He denies any other focal deficits, confusion or weakness. He didn't want  to come to ER and family firmly suggested he come to ED today as they were concerned for stroke.    PT Comments    Patient states he is feeling a little weak this morning although he reports no pain.  He has not eaten, NPO for a procedure later today.  Patient is agreeable to physical therapy and comes to EOB with extra time modified independent.  Able to sit on EOB with SBA and performs sit to stand to RW used today because patient is feeling week with SBA from therapist.  Patient able to walk x 80 ft with RW and SBA/min guard assist out into the hallway and back.  Once seated, therapist guides him in heel/toe raises and LAQs x 10 each.  At end of session patient left in chair with call bell in reach and legs elevated. Nursing notified of mobility and status and end of session status.   Patient will benefit from continued therapy services while he remains in the hospital and at the below recommended venue of care to address deficits and promote optimal functional mobility.      Recommendations for follow up therapy are one component of a multi-disciplinary discharge planning process, led by the attending physician.  Recommendations may be updated based on patient status, additional functional criteria and insurance authorization.  Follow Up Recommendations  Home health PT      Assistance Recommended at Discharge PRN  Patient can return home with the following A little help with walking and/or transfers;Assistance with cooking/housework;Help with stairs or ramp for entrance   Equipment Recommendations  None recommended by PT    Recommendations for Other Services       Precautions / Restrictions Precautions Precautions: None Restrictions Weight Bearing Restrictions: No     Mobility  Bed Mobility Overal bed mobility: Modified Independent             General bed mobility comments: Modified independent with supine to sit with use of UE and trunk musculature momentum to complete task.    Transfers Overall transfer level: Modified independent                 General transfer comment: sit to stand with RW with therapist SBA    Ambulation/Gait Ambulation/Gait assistance: Supervision, Min guard Gait Distance (Feet): 80 Feet Assistive device: Rolling walker (2 wheels) Gait Pattern/deviations: Decreased step length - right, Decreased step length - left, Decreased stride length, Narrow base of support Gait velocity: decreased     General Gait Details: had patient use a RW today as he reports weakness and he has not eaten this morning as he is NPO for a procedure later today   Stairs             Wheelchair Mobility    Modified Rankin (Stroke Patients Only)  Balance Overall balance assessment: Needs assistance Sitting-balance support: Bilateral upper extremity supported, Feet supported Sitting balance-Leahy Scale: Good Sitting balance - Comments: Patient able to sit EOB with ability to support himself in upright position.   Standing balance support: Bilateral upper extremity supported, During functional activity, Reliant on assistive device for balance Standing balance-Leahy Scale: Good Standing balance comment: good standing balance with RW today                            Cognition Arousal/Alertness:  Awake/alert Behavior During Therapy: WFL for tasks assessed/performed Overall Cognitive Status: Within Functional Limits for tasks assessed                                 General Comments: pleasant and cooperative; [facility]        Exercises Other Exercises Other Exercises: seated ankle pumps and LAQ's x 10 reps each today    General Comments        Pertinent Vitals/Pain Pain Assessment Pain Assessment: No/denies pain    Home Living                          Prior Function            PT Goals (current goals can now be found in the care plan section) Acute Rehab PT Goals Patient Stated Goal: return home PT Goal Formulation: With patient Time For Goal Achievement: 06/26/21 Potential to Achieve Goals: Good Progress towards PT goals: Progressing toward goals    Frequency    Min 3X/week      PT Plan Current plan remains appropriate    Co-evaluation              AM-PAC PT "6 Clicks" Mobility   Outcome Measure  Help needed turning from your back to your side while in a flat bed without using bedrails?: None Help needed moving from lying on your back to sitting on the side of a flat bed without using bedrails?: None Help needed moving to and from a bed to a chair (including a wheelchair)?: A Little Help needed standing up from a chair using your arms (e.g., wheelchair or bedside chair)?: A Little Help needed to walk in hospital room?: A Little Help needed climbing 3-5 steps with a railing? : A Lot 6 Click Score: 19    End of Session Equipment Utilized During Treatment: Other (comment) (RW) Activity Tolerance: Patient tolerated treatment well;Patient limited by fatigue;No increased pain Patient left: in chair;with call bell/phone within reach Nurse Communication: Mobility status PT Visit Diagnosis: Unsteadiness on feet (R26.81);Other abnormalities of gait and mobility (R26.89);Muscle weakness (generalized) (M62.81)     Time:  7681-1572 PT Time Calculation (min) (ACUTE ONLY): 16 min  Charges:  $Therapeutic Activity: 8-22 mins                     10:50 AM, 06/13/21 Ronnie Lawrence MPT [facility] [address] [phone] TD:[phone]

## 2021-06-13 NOTE — Transfer of Care (Signed)
Immediate Anesthesia Transfer of Care Note  Patient: Ronnie Lawrence  Procedure(s) Performed: ESOPHAGOGASTRODUODENOSCOPY (EGD) WITH PROPOFOL COLONOSCOPY WITH PROPOFOL ESOPHAGEAL DILATION BIOPSY  Patient Location: PACU  Anesthesia Type:General  Level of Consciousness: drowsy and responds to stimulation  Airway & Oxygen Therapy: Patient Spontanous Breathing and Patient connected to nasal cannula oxygen  Post-op Assessment: Report given to RN and Post -op Vital signs reviewed and stable  Post vital signs: Reviewed and stable  Last Vitals:  Vitals Value Taken Time  BP 111/54 06/13/21 1533  Temp 36.6 C 06/13/21 1533  Pulse 62 06/13/21 1537  Resp 17 06/13/21 1537  SpO2 100 % 06/13/21 1537  Vitals shown include unvalidated device data.  Last Pain:  Vitals:   06/13/21 1455  TempSrc:   PainSc: 0-No pain      Patients Stated Pain Goal: 5 (17/98/10 2548)  Complications: No notable events documented.

## 2021-06-13 NOTE — Anesthesia Preprocedure Evaluation (Signed)
Anesthesia Evaluation  Patient identified by MRN, date of birth, ID band Patient awake    Reviewed: Allergy & Precautions, NPO status , Patient's Chart, lab work & pertinent test results  Airway Mallampati: I  TM Distance: >3 FB Neck ROM: Full    Dental  (+) Dental Advisory Given, Missing   Pulmonary asthma , former smoker,  Left lung mass   Pulmonary exam normal breath sounds clear to auscultation       Cardiovascular Exercise Tolerance: Poor hypertension, Pt. on medications Normal cardiovascular exam+ dysrhythmias Atrial Fibrillation + pacemaker  Rhythm:Regular Rate:Normal     Neuro/Psych CVA, Residual Symptoms negative psych ROS   GI/Hepatic negative GI ROS, Neg liver ROS,   Endo/Other  diabetes, Well Controlled, Type 2, Oral Hypoglycemic Agents  Renal/GU negative Renal ROS  negative genitourinary   Musculoskeletal negative musculoskeletal ROS (+)   Abdominal   Peds negative pediatric ROS (+)  Hematology  (+) Blood dyscrasia (Myeloproliferative disorder (HCC) CALR positive), anemia ,   Anesthesia Other Findings IMPRESSION: There is 4.7 x 3.8 cm mass lesion with spiculated margins in the anterior left mid lung fields in the left upper lobe. The lesion is extending to the pleura with focal pleural thickening. This lesion has to be considered a malignant neoplasm until proven otherwise. Follow-up PET-CT and biopsy should be considered.  There are slightly enlarged lymph nodes in the mediastinum largest measuring 1.5 x 1 cm in the aortopulmonary window. This may suggest incidental reactive hyperplasia of nodes or metastatic disease.  There is low-attenuation in the body of L1 vertebra with coarse trabeculae suggesting possible hemangioma. Less likely possibility would be skeletal metastatic disease. This finding could also be evaluated in the PET-CT.  There is 2.8 cm nodule in the left adrenal, possibly  adenoma. This finding could also be evaluated in the PET-CT. 0  Fatty liver. Gallbladder stones. There is moderate sized hiatal hernia. Coronary artery calcifications are seen.  Other findings as described in the body of the report.   Electronically Signed   By: Elmer Picker M.D.   On: 06/11/2021 13:47   Reproductive/Obstetrics negative OB ROS                             Anesthesia Physical Anesthesia Plan  ASA: 4  Anesthesia Plan: General   Post-op Pain Management: Minimal or no pain anticipated   Induction: Intravenous  PONV Risk Score and Plan: Propofol infusion  Airway Management Planned: Nasal Cannula and Natural Airway  Additional Equipment:   Intra-op Plan:   Post-operative Plan:   Informed Consent: I have reviewed the patients History and Physical, chart, labs and discussed the procedure including the risks, benefits and alternatives for the proposed anesthesia with the patient or authorized representative who has indicated his/her understanding and acceptance.    Discussed DNR with patient and Suspend DNR.   Dental advisory given  Plan Discussed with: CRNA and Surgeon  Anesthesia Plan Comments: (Agreed to got brief resuscitation in case of cardiac arrest, do not want prolonged CPR)        Anesthesia Quick Evaluation

## 2021-06-14 ENCOUNTER — Telehealth: Payer: Self-pay | Admitting: Gastroenterology

## 2021-06-14 DIAGNOSIS — D649 Anemia, unspecified: Secondary | ICD-10-CM | POA: Diagnosis not present

## 2021-06-14 LAB — CBC
HCT: 33.6 % — ABNORMAL LOW (ref 39.0–52.0)
Hemoglobin: 10.5 g/dL — ABNORMAL LOW (ref 13.0–17.0)
MCH: 30.1 pg (ref 26.0–34.0)
MCHC: 31.3 g/dL (ref 30.0–36.0)
MCV: 96.3 fL (ref 80.0–100.0)
Platelets: 377 10*3/uL (ref 150–400)
RBC: 3.49 MIL/uL — ABNORMAL LOW (ref 4.22–5.81)
RDW: 14.4 % (ref 11.5–15.5)
WBC: 31.6 10*3/uL — ABNORMAL HIGH (ref 4.0–10.5)
nRBC: 0 % (ref 0.0–0.2)

## 2021-06-14 LAB — BASIC METABOLIC PANEL
Anion gap: 6 (ref 5–15)
BUN: 14 mg/dL (ref 8–23)
CO2: 26 mmol/L (ref 22–32)
Calcium: 8.2 mg/dL — ABNORMAL LOW (ref 8.9–10.3)
Chloride: 107 mmol/L (ref 98–111)
Creatinine, Ser: 1.22 mg/dL (ref 0.61–1.24)
GFR, Estimated: 56 mL/min — ABNORMAL LOW (ref >60)
Glucose, Bld: 89 mg/dL (ref 70–99)
Potassium: 3.5 mmol/L (ref 3.5–5.1)
Sodium: 139 mmol/L (ref 135–145)

## 2021-06-14 LAB — GLUCOSE, CAPILLARY
Glucose-Capillary: 89 mg/dL (ref 70–99)
Glucose-Capillary: 118 mg/dL — ABNORMAL HIGH (ref 70–99)

## 2021-06-14 MED ORDER — PANTOPRAZOLE SODIUM 40 MG PO TBEC: 40.0000 mg | DELAYED_RELEASE_TABLET | Freq: Every day | ORAL | 1 refills | Status: DC

## 2021-06-14 MED ORDER — PANTOPRAZOLE SODIUM 40 MG PO TBEC: 40.0000 mg | DELAYED_RELEASE_TABLET | Freq: Two times a day (BID) | ORAL | 1 refills | Status: AC

## 2021-06-14 MED ORDER — MIRTAZAPINE 7.5 MG PO TABS: 7.5000 mg | ORAL_TABLET | Freq: Every day | ORAL | 0 refills | Status: AC

## 2021-06-14 MED ORDER — APIXABAN 2.5 MG PO TABS: 2.5000 mg | ORAL_TABLET | Freq: Two times a day (BID) | ORAL | 2 refills | Status: AC

## 2021-06-14 NOTE — Assessment & Plan Note (Signed)
Resolved Continue PPI, Eliquis to be started at lower dose  in 1 week

## 2021-06-14 NOTE — Telephone Encounter (Signed)
Is a patient previously of Dr. Laural Golden.  Recently seen in the hospital for iron deficiency anemia.  Had EGD and colonoscopy.  Needs hospital follow-up in 4 to 6 weeks.  Venetia Night, MSN, FNP-BC, AGACNP-BC [facility]

## 2021-06-14 NOTE — Discharge Summary (Signed)
Physician Discharge Summary   Patient: Ronnie Lawrence MRN: [id number] DOB: Feb 22, 1927  Admit date:     06/11/2021  Discharge date: 06/14/21  Discharge Physician: Deatra James   PCP: [name], MD   Recommendations at discharge:   Follow-up with PCP in 1-2 weeks Continue Protonix as directed Change Xarelto to Eliquis, to be started on 06/21/2021  Discharge Diagnoses: Principal Problem:   Symptomatic anemia with GI bleed  Active Problems:   new dysarthria with history of past CVA    Lung mass considered malignant until proven otherwise   Controlled type 2 diabetes mellitus without complication, without long-term current use of insulin (HCC)   Failure to thrive in adult   Atrioventricular block, Mobitz type 1, Wenckebach s/p PPM    Myeloproliferative disorder (HCC) CALR positive   Atrial flutter (HCC)   Gout   History of CVA (cerebrovascular accident)   GI bleed  Resolved Problems:   Heme positive stool  Hospital Course:  88 y.o. male with medical history significant of T2DM, HTN, hx of CVA, gout, atrial flutter on xarelto admitted on 06/11/2021 with a 5 g drop in hemoglobin and concerns for GI bleed with heme positive stools as well as increasing weakness concerning for speech and possible stroke symptoms.   Assessment and Plan: * Symptomatic anemia with GI bleed  88 year old male presenting with 2 week history of fatigue, weakness, poor PO intake found to have 5g drop in hemoglobin over last 3 months with positive fecal occult  -no active signs of bleeding. 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 I again discussed with him again and his daughter and he would like to stay here -Neurochecks per protocol -echo reviewed negative for any acute deformity ejection fraction preserved -holding xarelto with GI bleed >>> changed to Eliquis in 5 days from now -Status post speech evaluation, tolerating p.o. -PT/ OT/ SLP -recommended home health arranged   Lung mass considered malignant until proven otherwise Already followed by oncology for his myeloproliferative disorder.  Oncology consult tomorrow  Palliative care also consulted, discussed with daughter  FTT picture with fatigue, weight loss/poor PO intake. Nutrition   Failure to thrive in adult In setting of likely malignancy Palliative care consult -confirmed DNR, discussed goals of treatment Nutrition consult,  Continue dietary supplements  Controlled type 2 diabetes mellitus without complication, without long-term current use of insulin (HCC) Resume metformin  Atrioventricular block, Mobitz type 1, Wenckebach s/p PPM  Followed by Dr. Quentin Ore He had a pacemaker implanted October 11, 2019. AF also has been detected on device interrogation  Switching from Xarelto to Eliquis per neurology recommendations  Atrial flutter ([place]) Noted on PPM  interrogation in 01/2020 and started on xarelto given CHA2DS2-VAsc of 4 Holding in setting of GI bleed, status post EGD colonoscopy, Switching Xarelto to Eliquis to be started in 1 week from now per GI  recommendation    Myeloproliferative disorder ([place]) CALR positive Followed by oncology. H&H dropped, WBC elevated.  Continue to follow cbc  Gout Continue allopurinol.  -May resume colchicine as needed   GI bleed Resolved Continue PPI, Eliquis to be started at lower dose  in 1 week  History of CVA (cerebrovascular accident) Minimal residual finding, continue home PT      Consultants: Neurology/palliative care/GI Procedures performed: EGD/colonoscopy Disposition: Home health Diet recommendation:  Discharge Diet Orders (From admission, onward)     Start     Ordered   06/14/21 0000  Diet - low sodium heart healthy        06/14/21 1041           Cardiac diet DISCHARGE MEDICATION: Allergies as of 06/14/2021   No Known Allergies      Medication List     STOP taking these medications    rivaroxaban 20 MG Tabs tablet Commonly known as: Xarelto       TAKE these medications    allopurinol 300 MG tablet Commonly known as: ZYLOPRIM Take 300 mg by mouth daily.   apixaban 2.5 MG Tabs tablet Commonly known as: Eliquis Take 1 tablet (2.5 mg total) by mouth 2 (two) times daily. Start taking on: June 21, 2021   atorvastatin 10 MG tablet Commonly known as: LIPITOR Take 10 mg by mouth at bedtime.   colchicine 0.6 MG tablet Take 2 tablets (1.2 mg) by mouth when you pick up your prescription.  2 hours after your first dose take one tablet.  12 hours later take one tablet.  Call your PCP for further medication/instructions. What changed:  how much to take how to take this when to take this additional instructions   magnesium gluconate 500 MG tablet Commonly known as: MAGONATE Take 500 mg by mouth daily.   metFORMIN 500 MG 24 hr tablet Commonly known as: GLUCOPHAGE-XR Take 500 mg by mouth 2 (two) times daily.   mirtazapine 7.5 MG tablet Commonly known as: REMERON Take 1 tablet (7.5 mg total) by mouth at bedtime.   pantoprazole 40 MG tablet Commonly  known as: Protonix Take 1 tablet (40 mg total) by mouth 2 (two) times daily.        Discharge Exam: Filed Weights   06/11/21 1118 06/13/21 1245  Weight: 73.5 kg 73 kg      Physical Exam:   General:  AAO x 3,  cooperative, no distress;   HEENT:  Normocephalic, PERRL, otherwise with in Normal limits   Neuro:  CNII-XII intact. , normal motor and sensation, reflexes intact   Lungs:   Clear to auscultation BL, Respirations unlabored,  No wheezes / crackles  Cardio:    S1/S2, RRR, No murmure, No Rubs or Gallops   Abdomen:  Soft, non-tender, bowel sounds active all four quadrants, no guarding or peritoneal signs.  Muscular  skeletal:  Limited exam -global generalized weaknesses - in bed, able to move all 4 extremities,   2+ pulses,  symmetric, No pitting edema  Skin:  Dry, warm to touch, negative for any Rashes,  Wounds: Please see nursing documentation          Condition at discharge: good  The results of significant diagnostics from this hospitalization (including imaging, microbiology, ancillary and laboratory) are listed below  for reference.   Imaging Studies: CT ANGIO HEAD NECK W WO CM  Result Date: 06/12/2021 CLINICAL DATA:  Stroke.  Slurred speech and unsteady gait. EXAM: CT ANGIOGRAPHY HEAD AND NECK TECHNIQUE: Multidetector CT imaging of the head and neck was performed using the standard protocol during bolus administration of intravenous contrast. Multiplanar CT image reconstructions and MIPs were obtained to evaluate the vascular anatomy. Carotid stenosis measurements (when applicable) are obtained utilizing NASCET criteria, using the distal internal carotid diameter as the denominator. RADIATION DOSE REDUCTION: This exam was performed according to the departmental dose-optimization program which includes automated exposure control, adjustment of the mA and/or kV according to patient size and/or use of iterative reconstruction technique. CONTRAST:  33mL OMNIPAQUE  IOHEXOL 350 MG/ML SOLN COMPARISON:  CT head 06/11/2021 FINDINGS: CT HEAD FINDINGS Brain: Generalized atrophy. Negative for hydrocephalus. Chronic infarct right parietal lobe unchanged. Chronic microvascular ischemic change in the white matter stable. Negative for acute infarct, hemorrhage, mass Vascular: Negative for hyperdense vessel Skull: Negative Sinuses: Negative Orbits: Bilateral cataract extraction Review of the MIP images confirms the above findings CTA NECK FINDINGS Aortic arch: Atherosclerotic calcification aortic arch and proximal great vessels. No significant stenosis or dissection. Left vertebral artery origin from the arch. Right carotid system: Atherosclerotic calcification right carotid bifurcation. Negative for stenosis. Left carotid system: Atherosclerotic calcification left common carotid artery, mild. Atherosclerotic calcification left carotid bifurcation without stenosis. Vertebral arteries: Both vertebral arteries patent to the skull base without stenosis. Skeleton: Cervical kyphosis and spondylosis. No acute skeletal finding. Other neck: Negative for mass or adenopathy in the neck. Upper chest: Apical emphysema.  Left-sided pacemaker. Review of the MIP images confirms the above findings CTA HEAD FINDINGS Anterior circulation: Atherosclerotic calcifications at the cavernous carotid bilaterally without significant stenosis. Anterior and middle cerebral arteries patent without stenosis or vascular malformation Posterior circulation: Both vertebral arteries patent to the basilar. Left PICA patent. Right PICA not visualized. Basilar widely patent. AICA, superior cerebellar, posterior cerebral arteries patent without stenosis or vascular malformation. Venous sinuses: Normal venous enhancement. Anatomic variants: None Review of the MIP images confirms the above findings IMPRESSION: CT head negative for acute abnormality. Atrophy and chronic ischemic changes stable from yesterday Negative for  intracranial large vessel occlusion Atherosclerotic disease in the aortic arch and carotid bifurcation bilaterally without significant stenosis. Electronically Signed   By: Franchot Gallo M.D.   On: 06/12/2021 15:09   DG Chest 1 View  Result Date: 06/11/2021 CLINICAL DATA:  Generalized weakness, slurred speech EXAM: CHEST  1 VIEW COMPARISON:  10/11/2019 FINDINGS: Cardiac size is within normal limits. There are no signs of pulmonary edema. There is 3.8 cm homogeneous nodule with slightly spiculated margins in the left parahilar region. Low position of diaphragms may suggest COPD. Linear densities in the left lower lung fields have not changed suggesting scarring. Pacemaker battery is seen in the left infraclavicular region. IMPRESSION: There is 3.8 cm homogeneous opacity in the left parahilar region suggesting possible malignant neoplasm. Less likely possibility would be pneumonia. Follow-up CT chest may be considered. Electronically Signed   By: Elmer Picker M.D.   On: 06/11/2021 12:05   CT HEAD WO CONTRAST  Result Date: 06/11/2021 CLINICAL DATA:  Neuro deficit, acute, stroke suspected EXAM: CT HEAD WITHOUT CONTRAST TECHNIQUE: Contiguous axial images were obtained from the base of the skull through the vertex without intravenous contrast. RADIATION DOSE REDUCTION: This exam was performed according to the departmental dose-optimization program which includes automated exposure control, adjustment of the mA and/or kV  according to patient size and/or use of iterative reconstruction technique. COMPARISON:  CT head November 03, 2020. FINDINGS: Brain: Increased size/conspicuity of and area of hypodensity in the right parietal lobe. No acute hemorrhage, mass lesion, midline shift, hydrocephalus. Cerebral atrophy. Chronic microvascular disease. Vascular: No hyperdense vessel identified. Skull: No acute fracture. Sinuses/Orbits: Left maxillary sinus mucosal thickening. No acute orbital findings. Other: No  mastoid effusions. IMPRESSION: Increased size/conspicuity of an area of hypodensity in the right parietal lobe, potentially evolution of previously seen infarct or acute on chronic infarct. If there is concern for acute infarct, recommend MRI. Electronically Signed   By: Margaretha Sheffield M.D.   On: 06/11/2021 12:00   CT Angio Chest PE W and/or Wo Contrast  Result Date: 06/11/2021 CLINICAL DATA:  Abnormal chest radiographs, high clinical suspicion for pulmonary embolism EXAM: CT ANGIOGRAPHY CHEST WITH CONTRAST TECHNIQUE: Multidetector CT imaging of the chest was performed using the standard protocol during bolus administration of intravenous contrast. Multiplanar CT image reconstructions and MIPs were obtained to evaluate the vascular anatomy. RADIATION DOSE REDUCTION: This exam was performed according to the departmental dose-optimization program which includes automated exposure control, adjustment of the mA and/or kV according to patient size and/or use of iterative reconstruction technique. CONTRAST:  61mL OMNIPAQUE IOHEXOL 350 MG/ML SOLN COMPARISON:  Previous studies including the chest radiographs done earlier today FINDINGS: Cardiovascular: Heart is enlarged in size. Pacemaker leads are noted in place. Coronary artery calcifications are seen. There is homogeneous enhancement in thoracic aorta. There is ectasia of ascending thoracic aorta measuring 3.8 cm. There are calcifications and atherosclerotic plaques in the thoracic aorta. There are no intraluminal filling defects in the pulmonary artery branches. Mediastinum/Nodes: There are few slightly enlarged lymph nodes in mediastinum and hilar regions largest measuring 1.5 x 1 cm in the aortopulmonary window. Lungs/Pleura: There is 4.7 x 3.8 cm noncalcified mass in the anterior left mid lung fields. Margins are slightly spiculated. This lesion is extending to the pleura with pleural thickening. There is narrowing of traversing pulmonary vascular branches  within the lesion. Centrilobular emphysema is seen. Increased interstitial markings are seen in the periphery of both lungs, more so in the lower lung fields. There are multiple blebs and bullae in the periphery of both lungs. There is no pleural effusion or pneumothorax. Upper Abdomen: There is fatty infiltration in the liver. Small calcific density in the gallbladder suggests gallbladder stones. There is moderate sized hiatal hernia. There is 2.8 cm nodule in the left adrenal. Density measurements are less than 10 Hounsfield units suggesting possible adenoma. Musculoskeletal: Degenerative changes are noted in the thoracic spine and visualized lower cervical spine. There is low attenuation with coarse trabeculae in the body of L1 vertebra. This finding is not optimally evaluated. This may suggest benign process such as hemangioma or metastatic disease. Review of the MIP images confirms the above findings. IMPRESSION: There is 4.7 x 3.8 cm mass lesion with spiculated margins in the anterior left mid lung fields in the left upper lobe. The lesion is extending to the pleura with focal pleural thickening. This lesion has to be considered a malignant neoplasm until proven otherwise. Follow-up PET-CT and biopsy should be considered. There are slightly enlarged lymph nodes in the mediastinum largest measuring 1.5 x 1 cm in the aortopulmonary window. This may suggest incidental reactive hyperplasia of nodes or metastatic disease. There is low-attenuation in the body of L1 vertebra with coarse trabeculae suggesting possible hemangioma. Less likely possibility would be skeletal metastatic disease. This finding  could also be evaluated in the PET-CT. There is 2.8 cm nodule in the left adrenal, possibly adenoma. This finding could also be evaluated in the PET-CT. 0 Fatty liver. Gallbladder stones. There is moderate sized hiatal hernia. Coronary artery calcifications are seen. Other findings as described in the body of the  report. Electronically Signed   By: Elmer Picker M.D.   On: 06/11/2021 13:47    Microbiology: Results for orders placed or performed during the hospital encounter of 06/11/21  Resp Panel by RT-PCR (Flu A&B, Covid) Nasopharyngeal Swab     Status: None   Collection Time: 06/11/21 12:38 PM   Specimen: Nasopharyngeal Swab; Nasopharyngeal(NP) swabs in vial transport medium  Result Value Ref Range Status   SARS Coronavirus 2 by RT PCR NEGATIVE NEGATIVE Final    Comment: (NOTE) SARS-CoV-2 target nucleic acids are NOT DETECTED.  The SARS-CoV-2 RNA is generally detectable in upper respiratory specimens during the acute phase of infection. The lowest concentration of SARS-CoV-2 viral copies this assay can detect is 138 copies/mL. A negative result does not preclude SARS-Cov-2 infection and should not be used as the sole basis for treatment or other patient management decisions. A negative result may occur with  improper specimen collection/handling, submission of specimen other than nasopharyngeal swab, presence of viral mutation(s) within the areas targeted by this assay, and inadequate number of viral copies(<138 copies/mL). A negative result must be combined with clinical observations, patient history, and epidemiological information. The expected result is Negative.  Fact Sheet for Patients:  EntrepreneurPulse.com.au  Fact Sheet for Healthcare Providers:  IncredibleEmployment.be  This test is no t yet approved or cleared by the [place] FDA and  has been authorized for detection and/or diagnosis of SARS-CoV-2 by FDA under an Emergency Use Authorization (EUA). This EUA will remain  in effect (meaning this test can be used) for the duration of the COVID-19 declaration under Section 564(b)(1) of the Act, 21 U.S.C.section 360bbb-3(b)(1), unless the authorization is terminated  or revoked sooner.       Influenza A by PCR NEGATIVE NEGATIVE  Final   Influenza B by PCR NEGATIVE NEGATIVE Final    Comment: (NOTE) The Xpert Xpress SARS-CoV-2/FLU/RSV plus assay is intended as an aid in the diagnosis of influenza from Nasopharyngeal swab specimens and should not be used as a sole basis for treatment. Nasal washings and aspirates are unacceptable for Xpert Xpress SARS-CoV-2/FLU/RSV testing.  Fact Sheet for Patients: EntrepreneurPulse.com.au  Fact Sheet for Healthcare Providers: IncredibleEmployment.be  This test is not yet approved or cleared by the [place] FDA and has been authorized for detection and/or diagnosis of SARS-CoV-2 by FDA under an Emergency Use Authorization (EUA). This EUA will remain in effect (meaning this test can be used) for the duration of the COVID-19 declaration under Section 564(b)(1) of the Act, 21 U.S.C. section 360bbb-3(b)(1), unless the authorization is terminated or revoked.  Performed at [facility], [address], [place], [place] [address]   Blood culture (routine x 2)     Status: None (Preliminary result)   Collection Time: 06/11/21 12:41 PM   Specimen: BLOOD RIGHT FOREARM  Result Value Ref Range Status   Specimen Description BLOOD RIGHT FOREARM  Final   Special Requests   Final    BOTTLES DRAWN AEROBIC AND ANAEROBIC Blood Culture results may not be optimal due to an excessive volume of blood received in culture bottles   Culture   Final    NO GROWTH 3 DAYS Performed at [facility],  [address], [place], [place] [address]    Report Status PENDING  Incomplete  Blood culture (routine x 2)     Status: None (Preliminary result)   Collection Time: 06/11/21 12:42 PM   Specimen: BLOOD LEFT FOREARM  Result Value Ref Range Status   Specimen Description BLOOD LEFT FOREARM  Final   Special Requests   Final    BOTTLES DRAWN AEROBIC AND ANAEROBIC Blood Culture adequate volume   Culture   Final    NO GROWTH 3 DAYS Performed at [facility], [address] [place], [place] [address]    Report Status PENDING  Incomplete    Labs: CBC: Recent Labs  Lab 06/11/21 1140 06/11/21 2219 06/12/21 0130 06/12/21 1030 06/14/21 0415  WBC 34.5* 33.6* 31.4* 33.9* 31.6*  NEUTROABS 29.3*  --   --   --   --   HGB 10.7* 10.0* 9.7* 11.1* 10.5*  HCT 34.0* 32.3* 31.5* 34.8* 33.6*  MCV 95.2 95.3 94.9 94.8 96.3  PLT 454* 376 365 427* 579   Basic Metabolic Panel: Recent Labs  Lab 06/11/21 1140 06/12/21 0130 06/14/21 0415  NA 136 138 139  K 4.3 4.2 3.5  CL 105 108 107  CO2 26 25 26   GLUCOSE 141* 100* 89  BUN 27* 19 14  CREATININE 1.26* 1.05 1.22  CALCIUM 8.9 8.4* 8.2*  MG 2.1  --   --    Liver Function Tests: Recent Labs  Lab 06/11/21 1140  AST 39  ALT 28  ALKPHOS 142*  BILITOT 0.5  PROT 6.8  ALBUMIN 3.4*   CBG: Recent Labs  Lab 06/12/21 1633 06/12/21 2116 06/13/21 0724 06/13/21 2032 06/14/21 0717  GLUCAP 115* 119* 98 152* 89    Discharge time spent: greater than 30 minutes.  Signed: Deatra James, MD [facility] 06/14/2021

## 2021-06-14 NOTE — Assessment & Plan Note (Signed)
Minimal residual finding, continue home PT

## 2021-06-16 LAB — CULTURE, BLOOD (ROUTINE X 2)
Culture: NO GROWTH
Culture: NO GROWTH
Special Requests: ADEQUATE

## 2021-06-19 LAB — GLUCOSE, CAPILLARY
Glucose-Capillary: 100 mg/dL — ABNORMAL HIGH (ref 70–99)
Glucose-Capillary: 98 mg/dL (ref 70–99)
Glucose-Capillary: 86 mg/dL (ref 70–99)

## 2021-06-19 LAB — SURGICAL PATHOLOGY

## 2021-06-20 ENCOUNTER — Encounter (HOSPITAL_COMMUNITY): Payer: Self-pay | Admitting: Internal Medicine

## 2021-06-20 ENCOUNTER — Other Ambulatory Visit: Payer: Self-pay | Admitting: Internal Medicine

## 2021-06-20 ENCOUNTER — Other Ambulatory Visit (HOSPITAL_COMMUNITY): Payer: Self-pay | Admitting: Internal Medicine

## 2021-06-20 DIAGNOSIS — R911 Solitary pulmonary nodule: Secondary | ICD-10-CM

## 2021-06-21 ENCOUNTER — Telehealth: Payer: Self-pay | Admitting: Nurse Practitioner

## 2021-06-21 NOTE — Telephone Encounter (Signed)
Attempted to contact patient's daughter Lindaann Pascal, to offer to schedule a Palliative Consult, no answer - left message with reason for call along with my name and call back number requesting a return call

## 2021-06-21 NOTE — Telephone Encounter (Signed)
Rec'd return call from daughter Lindaann Pascal and we discussed the Palliative referral/services and daughter stated that she didn't think patient would be in agreement with this, but daughter feels that he needs our services.  Daughter stated that she would talk with him about it and she took my name and number and will call me back, after he has his PET scan on 06/28/21, to let me know what he has decided to do.

## 2021-06-28 ENCOUNTER — Encounter (HOSPITAL_COMMUNITY): Payer: Self-pay

## 2021-06-28 ENCOUNTER — Encounter (HOSPITAL_COMMUNITY): Payer: Medicare Other

## 2021-07-09 ENCOUNTER — Ambulatory Visit (INDEPENDENT_AMBULATORY_CARE_PROVIDER_SITE_OTHER): Payer: Medicare Other

## 2021-07-09 DIAGNOSIS — I442 Atrioventricular block, complete: Secondary | ICD-10-CM

## 2021-07-09 LAB — CUP PACEART REMOTE DEVICE CHECK
Battery Remaining Longevity: 107 mo
Battery Remaining Percentage: 85 %
Battery Voltage: 3.02 V
Brady Statistic AP VP Percent: 13 %
Brady Statistic AP VS Percent: 2.3 %
Brady Statistic AS VP Percent: 66 %
Brady Statistic AS VS Percent: 19 %
Brady Statistic RA Percent Paced: 15 %
Brady Statistic RV Percent Paced: 79 %
Date Time Interrogation Session: 20230619020013
Implantable Lead Implant Date: 20210920
Implantable Lead Implant Date: 20210920
Implantable Lead Location: 753859
Implantable Lead Location: 753860
Implantable Pulse Generator Implant Date: 20210920
Lead Channel Impedance Value: 430 Ohm
Lead Channel Impedance Value: 640 Ohm
Lead Channel Pacing Threshold Amplitude: 0.375 V
Lead Channel Pacing Threshold Amplitude: 0.625 V
Lead Channel Pacing Threshold Pulse Width: 0.5 ms
Lead Channel Pacing Threshold Pulse Width: 0.5 ms
Lead Channel Sensing Intrinsic Amplitude: 11.1 mV
Lead Channel Sensing Intrinsic Amplitude: 3.6 mV
Lead Channel Setting Pacing Amplitude: 0.625
Lead Channel Setting Pacing Amplitude: 1.625
Lead Channel Setting Pacing Pulse Width: 0.5 ms
Lead Channel Setting Sensing Sensitivity: 2 mV
Pulse Gen Model: 2272
Pulse Gen Serial Number: 3860652

## 2021-07-13 ENCOUNTER — Telehealth: Payer: Self-pay | Admitting: Nurse Practitioner

## 2021-07-26 NOTE — Progress Notes (Signed)
Remote pacemaker transmission.   

## 2021-07-31 ENCOUNTER — Inpatient Hospital Stay (HOSPITAL_COMMUNITY): Payer: Medicare Other | Attending: Hematology | Admitting: Hematology

## 2021-07-31 ENCOUNTER — Inpatient Hospital Stay (HOSPITAL_COMMUNITY): Payer: Medicare Other

## 2021-07-31 VITALS — BP 95/64 | HR 87 | Temp 97.5°F | Resp 18 | Ht 70.0 in | Wt 149.1 lb

## 2021-07-31 DIAGNOSIS — E119 Type 2 diabetes mellitus without complications: Secondary | ICD-10-CM | POA: Insufficient documentation

## 2021-07-31 DIAGNOSIS — Z803 Family history of malignant neoplasm of breast: Secondary | ICD-10-CM | POA: Insufficient documentation

## 2021-07-31 DIAGNOSIS — Z7901 Long term (current) use of anticoagulants: Secondary | ICD-10-CM | POA: Insufficient documentation

## 2021-07-31 DIAGNOSIS — Z87891 Personal history of nicotine dependence: Secondary | ICD-10-CM | POA: Insufficient documentation

## 2021-07-31 DIAGNOSIS — I441 Atrioventricular block, second degree: Secondary | ICD-10-CM | POA: Insufficient documentation

## 2021-07-31 DIAGNOSIS — I10 Essential (primary) hypertension: Secondary | ICD-10-CM | POA: Diagnosis not present

## 2021-07-31 DIAGNOSIS — R634 Abnormal weight loss: Secondary | ICD-10-CM | POA: Insufficient documentation

## 2021-07-31 DIAGNOSIS — Z8673 Personal history of transient ischemic attack (TIA), and cerebral infarction without residual deficits: Secondary | ICD-10-CM | POA: Diagnosis not present

## 2021-07-31 DIAGNOSIS — M109 Gout, unspecified: Secondary | ICD-10-CM | POA: Insufficient documentation

## 2021-07-31 DIAGNOSIS — Z950 Presence of cardiac pacemaker: Secondary | ICD-10-CM | POA: Diagnosis not present

## 2021-07-31 DIAGNOSIS — R918 Other nonspecific abnormal finding of lung field: Secondary | ICD-10-CM | POA: Diagnosis present

## 2021-07-31 DIAGNOSIS — R42 Dizziness and giddiness: Secondary | ICD-10-CM | POA: Insufficient documentation

## 2021-07-31 DIAGNOSIS — R590 Localized enlarged lymph nodes: Secondary | ICD-10-CM | POA: Insufficient documentation

## 2021-07-31 DIAGNOSIS — Z79899 Other long term (current) drug therapy: Secondary | ICD-10-CM | POA: Insufficient documentation

## 2021-07-31 DIAGNOSIS — R5383 Other fatigue: Secondary | ICD-10-CM | POA: Insufficient documentation

## 2021-07-31 DIAGNOSIS — Z800 Family history of malignant neoplasm of digestive organs: Secondary | ICD-10-CM | POA: Insufficient documentation

## 2021-07-31 DIAGNOSIS — Z802 Family history of malignant neoplasm of other respiratory and intrathoracic organs: Secondary | ICD-10-CM | POA: Diagnosis not present

## 2021-07-31 DIAGNOSIS — D649 Anemia, unspecified: Secondary | ICD-10-CM | POA: Insufficient documentation

## 2021-07-31 DIAGNOSIS — C3490 Malignant neoplasm of unspecified part of unspecified bronchus or lung: Secondary | ICD-10-CM

## 2021-07-31 DIAGNOSIS — Z7984 Long term (current) use of oral hypoglycemic drugs: Secondary | ICD-10-CM | POA: Insufficient documentation

## 2021-07-31 DIAGNOSIS — J45909 Unspecified asthma, uncomplicated: Secondary | ICD-10-CM | POA: Insufficient documentation

## 2021-07-31 DIAGNOSIS — D72829 Elevated white blood cell count, unspecified: Secondary | ICD-10-CM

## 2021-07-31 DIAGNOSIS — D500 Iron deficiency anemia secondary to blood loss (chronic): Secondary | ICD-10-CM

## 2021-07-31 LAB — CBC WITH DIFFERENTIAL/PLATELET
Abs Immature Granulocytes: 0.29 10*3/uL — ABNORMAL HIGH (ref 0.00–0.07)
Basophils Absolute: 0.1 10*3/uL (ref 0.0–0.1)
Basophils Relative: 1 %
Eosinophils Absolute: 0.3 10*3/uL (ref 0.0–0.5)
Eosinophils Relative: 1 %
HCT: 33.9 % — ABNORMAL LOW (ref 39.0–52.0)
Hemoglobin: 10.8 g/dL — ABNORMAL LOW (ref 13.0–17.0)
Immature Granulocytes: 1 %
Lymphocytes Relative: 6 %
Lymphs Abs: 1.7 10*3/uL (ref 0.7–4.0)
MCH: 29 pg (ref 26.0–34.0)
MCHC: 31.9 g/dL (ref 30.0–36.0)
MCV: 90.9 fL (ref 80.0–100.0)
Monocytes Absolute: 2.1 10*3/uL — ABNORMAL HIGH (ref 0.1–1.0)
Monocytes Relative: 8 %
Neutro Abs: 21.8 10*3/uL — ABNORMAL HIGH (ref 1.7–7.7)
Neutrophils Relative %: 83 %
Platelets: 419 10*3/uL — ABNORMAL HIGH (ref 150–400)
RBC: 3.73 MIL/uL — ABNORMAL LOW (ref 4.22–5.81)
RDW: 16.4 % — ABNORMAL HIGH (ref 11.5–15.5)
WBC: 26.1 10*3/uL — ABNORMAL HIGH (ref 4.0–10.5)
nRBC: 0 % (ref 0.0–0.2)

## 2021-07-31 LAB — COMPREHENSIVE METABOLIC PANEL
ALT: 18 U/L (ref 0–44)
AST: 19 U/L (ref 15–41)
Albumin: 3.4 g/dL — ABNORMAL LOW (ref 3.5–5.0)
Alkaline Phosphatase: 122 U/L (ref 38–126)
Anion gap: 7 (ref 5–15)
BUN: 23 mg/dL (ref 8–23)
CO2: 24 mmol/L (ref 22–32)
Calcium: 8.7 mg/dL — ABNORMAL LOW (ref 8.9–10.3)
Chloride: 104 mmol/L (ref 98–111)
Creatinine, Ser: 1.37 mg/dL — ABNORMAL HIGH (ref 0.61–1.24)
GFR, Estimated: 49 mL/min — ABNORMAL LOW (ref >60)
Glucose, Bld: 142 mg/dL — ABNORMAL HIGH (ref 70–99)
Potassium: 4.8 mmol/L (ref 3.5–5.1)
Sodium: 135 mmol/L (ref 135–145)
Total Bilirubin: 1.5 mg/dL — ABNORMAL HIGH (ref 0.3–1.2)
Total Protein: 7.3 g/dL (ref 6.5–8.1)

## 2021-07-31 LAB — IRON AND TIBC
Iron: 32 ug/dL — ABNORMAL LOW (ref 45–182)
Saturation Ratios: 13 % — ABNORMAL LOW (ref 17.9–39.5)
TIBC: 238 ug/dL — ABNORMAL LOW (ref 250–450)
UIBC: 206 ug/dL

## 2021-07-31 LAB — FERRITIN: Ferritin: 290 ng/mL (ref 24–336)

## 2021-07-31 LAB — FOLATE: Folate: 11.7 ng/mL (ref >5.9)

## 2021-07-31 LAB — VITAMIN B12: Vitamin B-12: 361 pg/mL (ref 180–914)

## 2021-07-31 LAB — RETICULOCYTES
Immature Retic Fract: 19.7 % — ABNORMAL HIGH (ref 2.3–15.9)
RBC.: 3.91 MIL/uL — ABNORMAL LOW (ref 4.22–5.81)
Retic Count, Absolute: 64.5 10*3/uL (ref 19.0–186.0)
Retic Ct Pct: 1.7 % (ref 0.4–3.1)

## 2021-07-31 LAB — LACTATE DEHYDROGENASE: LDH: 101 U/L (ref 98–192)

## 2021-07-31 NOTE — Progress Notes (Signed)
[name], [place] [address]   CLINIC:  Medical Oncology/Hematology  CONSULT NOTE  Patient Care Team: [name], MD as PCP - General (Internal Medicine) Vickie Epley, MD as PCP - Electrophysiology (Cardiology)  CHIEF COMPLAINTS/PURPOSE OF CONSULTATION:  Evaluation for left upper lobe lung mass  HISTORY OF PRESENTING ILLNESS:  [name] 88 y.o. male is here because of evaluation for left upper lobe lung mass.  Today he reports feeling well. He has previously been seen at the clinic for leukocytosis. His appetite has been good since starting prednisone. He stopped prednisone on 7/7, and he continues to have good appetite. He reports fatigue which was present prior to and following taking prednisone. He has lost 14 lbs since his last visit. He denies light-headedness upon standing. He reports 1 fall 1 week ago; he denies syncope. He denies abdominal pain, hematochezia, hematuria, and black stools. He denies history of MI and reports 1 CVA episode. He denies history of low BP.   He lives at home on his ow, and he is able to do his typical daily activities. He quit smoking in 1983, and previously he smoked 1 pack a week 30 years. His sister had breast and lung cancer, his brother had colon cancer, and his mother had colon cancer. Prior to retirement he worked for [employer]. He denies exposure to asbestos.   MEDICAL HISTORY:  Past Medical History:  Diagnosis Date   Asthma    Diabetes mellitus    Gout    Hypertension    Stroke [facility])    Wears dentures    partial upper    SURGICAL HISTORY: Past Surgical History:  Procedure Laterality Date   BACK SURGERY     BIOPSY  06/13/2021   Procedure: BIOPSY;  Surgeon: Rogene Houston, MD;  [facility];  Service: Endoscopy;;   CATARACT EXTRACTION W/PHACO Left 08/31/2019   Procedure: CATARACT EXTRACTION PHACO AND INTRAOCULAR LENS PLACEMENT ([place]) LEFT DIABETIC 9.58 00:51.0;  Surgeon:  Birder Robson, MD;  Location: [facility];  Service: Ophthalmology;  Laterality: Left;  Diabetic - oral meds   CATARACT EXTRACTION W/PHACO Right 09/21/2019   Procedure: CATARACT EXTRACTION PHACO AND INTRAOCULAR LENS PLACEMENT ([facility]) RIGHT DIABETIC 13.56  01:10.7;  Surgeon: Birder Robson, MD;  Location: [facility];  Service: Ophthalmology;  Laterality: Right;   COLONOSCOPY N/A 08/13/2013   Procedure: COLONOSCOPY;  Surgeon: Rogene Houston, MD;  [facility];  Service: Endoscopy;  Laterality: N/A;  1050   COLONOSCOPY WITH PROPOFOL N/A 06/13/2021   Procedure: COLONOSCOPY WITH PROPOFOL;  Surgeon: Rogene Houston, MD;  [facility];  Service: Endoscopy;  Laterality: N/A;   ESOPHAGEAL DILATION  06/13/2021   Procedure: ESOPHAGEAL DILATION;  Surgeon: Rogene Houston, MD;  [facility];  Service: Endoscopy;;   ESOPHAGOGASTRODUODENOSCOPY (EGD) WITH PROPOFOL N/A 06/13/2021   Procedure: ESOPHAGOGASTRODUODENOSCOPY (EGD) WITH PROPOFOL;  Surgeon: Rogene Houston, MD;  [facility];  Service: Endoscopy;  Laterality: N/A;   HERNIA REPAIR     bilateral   PACEMAKER IMPLANT N/A 10/11/2019   Procedure: PACEMAKER IMPLANT;  Surgeon: Vickie Epley, MD;  Location: [facility];  Service: Cardiovascular;  Laterality: N/A;    SOCIAL HISTORY: Social History   Socioeconomic History   Marital status: Widowed    Spouse name: Not on file   Number of children: Not on file   Years of education: Not on file  Highest education level: Not on file  Occupational History   Not on file  Tobacco Use   Smoking status: Former    Types: Cigarettes    Quit date: 82    Years since quitting: 42.5   Smokeless tobacco: Never  Vaping Use   Vaping Use: Never used  Substance and Sexual Activity   Alcohol use: Yes    Alcohol/week: 4.0 standard drinks of alcohol    Types: 4 Standard drinks or equivalent per week    Comment: few days a week, will have 1  shot before dinner.   Drug use: No   Sexual activity: Not on file  Other Topics Concern   Not on file  Social History Narrative   Not on file   Social Determinants of Health   Financial Resource Strain: Not on file  Food Insecurity: Not on file  Transportation Needs: Not on file  Physical Activity: Not on file  Stress: Not on file  Social Connections: Not on file  Intimate Partner Violence: Not on file    FAMILY HISTORY: Family History  Problem Relation Age of Onset   Colon cancer Mother     ALLERGIES:  has No Known Allergies.  MEDICATIONS:  Current Outpatient Medications  Medication Sig Dispense Refill   allopurinol (ZYLOPRIM) 300 MG tablet Take 300 mg by mouth daily.     apixaban (ELIQUIS) 2.5 MG TABS tablet Take 1 tablet (2.5 mg total) by mouth 2 (two) times daily. 60 tablet 2   atorvastatin (LIPITOR) 10 MG tablet Take 10 mg by mouth at bedtime.     colchicine 0.6 MG tablet Take 2 tablets (1.2 mg) by mouth when you pick up your prescription.  2 hours after your first dose take one tablet.  12 hours later take one tablet.  Call your PCP for further medication/instructions. (Patient taking differently: Take 0.6 mg by mouth daily.) 4 tablet 0   magnesium gluconate (MAGONATE) 500 MG tablet Take 500 mg by mouth daily.     metFORMIN (GLUCOPHAGE-XR) 500 MG 24 hr tablet Take 500 mg by mouth 2 (two) times daily.     mirtazapine (REMERON) 7.5 MG tablet Take 1 tablet (7.5 mg total) by mouth at bedtime. 60 tablet 0   pantoprazole (PROTONIX) 40 MG tablet Take 1 tablet (40 mg total) by mouth 2 (two) times daily. 60 tablet 1   No current facility-administered medications for this visit.    REVIEW OF SYSTEMS:   Review of Systems  Constitutional:  Positive for fatigue and unexpected weight change (-14 lbs). Negative for appetite change.  Gastrointestinal:  Negative for abdominal pain and blood in stool.  Genitourinary:  Negative for hematuria.   Neurological:  Positive for  dizziness. Negative for light-headedness.  All other systems reviewed and are negative.    PHYSICAL EXAMINATION: ECOG PERFORMANCE STATUS: 1 - Symptomatic but completely ambulatory  There were no vitals filed for this visit. There were no vitals filed for this visit. Physical Exam Vitals reviewed.  Constitutional:      Appearance: Normal appearance.     Comments: In wheelchair  Cardiovascular:     Rate and Rhythm: Normal rate and regular rhythm.     Pulses: Normal pulses.     Heart sounds: Normal heart sounds.  Pulmonary:     Effort: Pulmonary effort is normal.     Breath sounds: Normal breath sounds.  Abdominal:     Palpations: Abdomen is soft. There is no hepatomegaly, splenomegaly or mass.  Tenderness: There is no abdominal tenderness.  Musculoskeletal:     Right lower leg: No edema.     Left lower leg: No edema.  Lymphadenopathy:     Upper Body:     Right upper body: No supraclavicular or axillary adenopathy.     Left upper body: No supraclavicular or axillary adenopathy.     Lower Body: No left inguinal adenopathy.  Neurological:     General: No focal deficit present.     Mental Status: He is alert and oriented to person, place, and time.  Psychiatric:        Mood and Affect: Mood normal.        Behavior: Behavior normal.      LABORATORY DATA:  I have reviewed the data as listed    Latest Ref Rng & Units 06/14/2021    4:15 AM 06/12/2021   10:30 AM 06/12/2021    1:30 AM  CBC  WBC 4.0 - 10.5 K/uL 31.6  33.9  31.4   Hemoglobin 13.0 - 17.0 g/dL 10.5  11.1  9.7   Hematocrit 39.0 - 52.0 % 33.6  34.8  31.5   Platelets 150 - 400 K/uL 377  427  365       Latest Ref Rng & Units 06/14/2021    4:15 AM 06/12/2021    1:30 AM 06/11/2021   11:40 AM  CMP  Glucose 70 - 99 mg/dL 89  100  141   BUN 8 - 23 mg/dL 14  19  27    Creatinine 0.61 - 1.24 mg/dL 1.22  1.05  1.26   Sodium 135 - 145 mmol/L 139  138  136   Potassium 3.5 - 5.1 mmol/L 3.5  4.2  4.3   Chloride 98 -  111 mmol/L 107  108  105   CO2 22 - 32 mmol/L 26  25  26    Calcium 8.9 - 10.3 mg/dL 8.2  8.4  8.9   Total Protein 6.5 - 8.1 g/dL   6.8   Total Bilirubin 0.3 - 1.2 mg/dL   0.5   Alkaline Phos 38 - 126 U/L   142   AST 15 - 41 U/L   39   ALT 0 - 44 U/L   28     RADIOGRAPHIC STUDIES: I have personally reviewed the radiological images as listed and agreed with the findings in the report. CUP PACEART REMOTE DEVICE CHECK  Result Date: 07/09/2021 Scheduled remote reviewed. Normal device function.  3 AMS, PAT, all 10sec in duration Next remote 91 days. [place]   ASSESSMENT:  Left upper lobe lung mass with mediastinal and hilar adenopathy: - CT chest (06/11/2021): 4.7 x 3.8 cm mass in the anterior left midlung fields, spiculated margins, lesion extending to the pleura with focal pleural thickening.  Enlarged lymph nodes in the mediastinum and hilar region largest 1.5 x 1 cm in the AP window.  Low-attenuation in the body of L1 vertebra, likely hemangioma.  2.8 cm nodule in the left adrenal gland, possibly adenoma. - 14 pound weight loss in the last 6 weeks.  Feels "lifeless".   CALR mutation positive MPN: - Seen at the request of Dr. Willey Blade for leukocytosis. - Weight loss 25 pounds in the last 6 months due to decreased appetite.  No fevers or night sweats. - Reports phlebotomies done in the 1980s, due to "too much blood". 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 - Based on his age, he is high risk.  However he did not have any prior history of thrombosis.  As he had normal hemoglobin and platelets, I did not recommend cytoreductive therapy.  Social/family history: - Lives by himself and is independent of ADLs and IADLs. - Worked as a Librarian, academic at [employer].  Quit smoking in 1983.  Smoked 1 pack/day for 30 years.  No asbestos exposure. - Sister had breast and lung cancer.  Brother had colon cancer.  Mother had  colon cancer.  PLAN:  Left upper lobe lung mass with hilar and mediastinal adenopathy: - We reviewed images of the CT scan with the patient and his daughters in detail. - Recommend PET CT scan for further staging. - Recommend MRI of the brain with and without contrast. - He has an appointment with Dr. Valeta Harms on 08/14/2021 for bronchoscopy and biopsy. - Follow-up with me after the biopsy.   CALR mutation positive MPN: - CBC today shows white count 26.  Hemoglobin is 10.8 and platelet count 419. - He was recently hospitalized with GI bleed and had to receive blood transfusion. - We will check for nutritional deficiencies including ferritin, iron panel, Y30, folic acid, copper, LDH, reticulocyte count.  We will also check SPEP and free light chains.  All questions were answered. The patient knows to call the clinic with any problems, questions or concerns.   Derek Jack, MD, 07/31/21 8:20 AM  [facility] [phone]   I, Thana Ates, am acting as a scribe for Dr. Derek Jack.  I, Derek Jack MD, have reviewed the above documentation for accuracy and completeness, and I agree with the above.

## 2021-07-31 NOTE — Patient Instructions (Addendum)
[place] at [facility] Discharge Instructions   You were seen and examined today by Dr. Delton Coombes.  He reviewed the results of your CT scan which shows a mass in your left lung that is suspicious for cancer. We will order more detailed scans to investigate this further.  We will also get further blood work today.   Return as scheduled after scans.    Thank you for choosing [place] at [facility] to provide your oncology and hematology care.  To afford each patient quality time with our provider, please arrive at least 15 minutes before your scheduled appointment time.   If you have a lab appointment with the [facility] please come in thru the Main Entrance and check in at the main information desk.  You need to re-schedule your appointment should you arrive 10 or more minutes late.  We strive to give you quality time with our providers, and arriving late affects you and other patients whose appointments are after yours.  Also, if you no show three or more times for appointments you may be dismissed from the clinic at the providers discretion.     Again, thank you for choosing [facility].  Our hope is that these requests will decrease the amount of time that you wait before being seen by our physicians.       _____________________________________________________________  Should you have questions after your visit to [facility], please contact our office at [phone] and follow the prompts.  Our office hours are 8:00 a.m. and 4:30 p.m. Monday - Friday.  Please note that voicemails left after 4:00 p.m. may not be returned until the following business day.  We are closed weekends and major holidays.  You do have access to a nurse 24-7, just call the main number to the clinic [phone] and do not press any options, hold on the line and a nurse will answer the phone.    For prescription refill requests, have  your pharmacy contact our office and allow 72 hours.    Due to Covid, you will need to wear a mask upon entering the hospital. If you do not have a mask, a mask will be given to you at the Main Entrance upon arrival. For doctor visits, patients may have 1 support person age 79 or older with them. For treatment visits, patients can not have anyone with them due to social distancing guidelines and our immunocompromised population.

## 2021-08-01 LAB — KAPPA/LAMBDA LIGHT CHAINS
Kappa free light chain: 38.1 mg/L — ABNORMAL HIGH (ref 3.3–19.4)
Kappa, lambda light chain ratio: 0.98 (ref 0.26–1.65)
Lambda free light chains: 39 mg/L — ABNORMAL HIGH (ref 5.7–26.3)

## 2021-08-03 LAB — PROTEIN ELECTROPHORESIS, SERUM
A/G Ratio: 1 (ref 0.7–1.7)
Albumin ELP: 3.1 g/dL (ref 2.9–4.4)
Alpha-1-Globulin: 0.4 g/dL (ref 0.0–0.4)
Alpha-2-Globulin: 0.9 g/dL (ref 0.4–1.0)
Beta Globulin: 1.1 g/dL (ref 0.7–1.3)
Gamma Globulin: 0.8 g/dL (ref 0.4–1.8)
Globulin, Total: 3.2 g/dL (ref 2.2–3.9)
Total Protein ELP: 6.3 g/dL (ref 6.0–8.5)

## 2021-08-03 LAB — COPPER, SERUM: Copper: 161 ug/dL — ABNORMAL HIGH (ref 69–132)

## 2021-08-06 ENCOUNTER — Ambulatory Visit (INDEPENDENT_AMBULATORY_CARE_PROVIDER_SITE_OTHER): Payer: Medicare Other | Admitting: Gastroenterology

## 2021-08-09 ENCOUNTER — Encounter (HOSPITAL_COMMUNITY)
Admission: RE | Admit: 2021-08-09 | Discharge: 2021-08-09 | Disposition: A | Discharge status: Home / Self Care | Payer: Medicare Other | Source: Ambulatory Visit | Attending: Hematology | Admitting: Hematology

## 2021-08-09 DIAGNOSIS — R918 Other nonspecific abnormal finding of lung field: Secondary | ICD-10-CM | POA: Diagnosis present

## 2021-08-09 DIAGNOSIS — C3490 Malignant neoplasm of unspecified part of unspecified bronchus or lung: Secondary | ICD-10-CM | POA: Diagnosis present

## 2021-08-09 MED ORDER — FLUDEOXYGLUCOSE F - 18 (FDG) INJECTION
8.0700 | Freq: Once | INTRAVENOUS | Status: AC | PRN
  Administered 2021-08-09: 8.07 via INTRAVENOUS

## 2021-08-10 ENCOUNTER — Ambulatory Visit (HOSPITAL_COMMUNITY)
Admission: RE | Admit: 2021-08-10 | Discharge: 2021-08-10 | Disposition: A | Discharge status: Home / Self Care | Payer: Medicare Other | Source: Ambulatory Visit | Attending: Hematology | Admitting: Hematology

## 2021-08-10 DIAGNOSIS — C3490 Malignant neoplasm of unspecified part of unspecified bronchus or lung: Secondary | ICD-10-CM

## 2021-08-13 ENCOUNTER — Other Ambulatory Visit (HOSPITAL_COMMUNITY): Payer: Self-pay | Admitting: *Deleted

## 2021-08-13 DIAGNOSIS — C3490 Malignant neoplasm of unspecified part of unspecified bronchus or lung: Secondary | ICD-10-CM

## 2021-08-14 ENCOUNTER — Ambulatory Visit (HOSPITAL_COMMUNITY): Payer: Medicare Other | Admitting: Hematology

## 2021-08-14 ENCOUNTER — Encounter: Payer: Self-pay | Admitting: Pulmonary Disease

## 2021-08-14 ENCOUNTER — Ambulatory Visit (INDEPENDENT_AMBULATORY_CARE_PROVIDER_SITE_OTHER): Payer: Medicare Other | Admitting: Pulmonary Disease

## 2021-08-14 VITALS — BP 116/62 | HR 80 | Temp 98.2°F | Ht 70.0 in | Wt 149.0 lb

## 2021-08-14 DIAGNOSIS — R590 Localized enlarged lymph nodes: Secondary | ICD-10-CM | POA: Diagnosis not present

## 2021-08-14 DIAGNOSIS — R942 Abnormal results of pulmonary function studies: Secondary | ICD-10-CM | POA: Diagnosis not present

## 2021-08-14 DIAGNOSIS — Z7901 Long term (current) use of anticoagulants: Secondary | ICD-10-CM | POA: Diagnosis not present

## 2021-08-14 DIAGNOSIS — R918 Other nonspecific abnormal finding of lung field: Secondary | ICD-10-CM | POA: Diagnosis not present

## 2021-08-14 NOTE — H&P (View-Only) (Signed)
Synopsis: Referred in July 2023 for a lung mass by [name], MD  Subjective:   PATIENT ID: Ronnie Lawrence: male DOB: [birth year], MRN: [id number]  Chief Complaint  Patient presents with   Consult    Pt is here for a consult for a mass in his lungs. No pulmonary side effects noted form patient at this time. PET scan was done 08/09/21    This is a 88 year old gentleman, past medical history of hypertension, stroke, diabetes, asthma, previous colonoscopy, pacemaker placement, previous EGD.  Patient is a former smoker quit 1981 smoked for 42 years.  Patient seen by Dr. Willey Blade primary care provider.Patient had a nuclear medicine PET scan completed on 08/09/2021 for evaluation of lung mass there was a hypermetabolic left upper lobe spiculated lesion concerning for a primary bronchogenic carcinoma with associated hypermetabolic AP window node concerning for nodal metastatic disease.  There is other minimally metabolic active mediastinal nodes.  Patient has already seen Dr. Delton Coombes at [place].  He is currently on Eliquis for atrial fibrillation.  Here today to discuss considerations for tissue biopsy.    Past Medical History:  Diagnosis Date   Asthma    Diabetes mellitus    Gout    Hypertension    Stroke [facility])    Wears dentures    partial upper     Family History  Problem Relation Age of Onset   Colon cancer Mother      Past Surgical History:  Procedure Laterality Date   BACK SURGERY     BIOPSY  06/13/2021   Procedure: BIOPSY;  Surgeon: Rogene Houston, MD;  [facility];  Service: Endoscopy;;   CATARACT EXTRACTION W/PHACO Left 08/31/2019   Procedure: CATARACT EXTRACTION PHACO AND INTRAOCULAR LENS PLACEMENT ([place]) LEFT DIABETIC 9.58 00:51.0;  Surgeon: Birder Robson, MD;  Location: [place];  Service: Ophthalmology;  Laterality: Left;  Diabetic - oral meds   CATARACT EXTRACTION W/PHACO Right 09/21/2019   Procedure: CATARACT EXTRACTION  PHACO AND INTRAOCULAR LENS PLACEMENT ([facility]) RIGHT DIABETIC 13.56  01:10.7;  Surgeon: Birder Robson, MD;  Location: [place];  Service: Ophthalmology;  Laterality: Right;   COLONOSCOPY N/A 08/13/2013   Procedure: COLONOSCOPY;  Surgeon: Rogene Houston, MD;  [facility];  Service: Endoscopy;  Laterality: N/A;  1050   COLONOSCOPY WITH PROPOFOL N/A 06/13/2021   Procedure: COLONOSCOPY WITH PROPOFOL;  Surgeon: Rogene Houston, MD;  [facility];  Service: Endoscopy;  Laterality: N/A;   ESOPHAGEAL DILATION  06/13/2021   Procedure: ESOPHAGEAL DILATION;  Surgeon: Rogene Houston, MD;  [facility];  Service: Endoscopy;;   ESOPHAGOGASTRODUODENOSCOPY (EGD) WITH PROPOFOL N/A 06/13/2021   Procedure: ESOPHAGOGASTRODUODENOSCOPY (EGD) WITH PROPOFOL;  Surgeon: Rogene Houston, MD;  [facility];  Service: Endoscopy;  Laterality: N/A;   HERNIA REPAIR     bilateral   PACEMAKER IMPLANT N/A 10/11/2019   Procedure: PACEMAKER IMPLANT;  Surgeon: Vickie Epley, MD;  Location: [place] [facility];  Service: Cardiovascular;  Laterality: N/A;    Social History   Socioeconomic History   Marital status: Widowed    Spouse name: Not on file   Number of children: Not on file   Years of education: Not on file   Highest education level: Not on file  Occupational History   Not on file  Tobacco Use   Smoking status: Former    Years: 15.00    Types: Cigarettes  Quit date: 25    Years since quitting: 42.5   Smokeless tobacco: Never  Vaping Use   Vaping Use: Never used  Substance and Sexual Activity   Alcohol use: Yes    Alcohol/week: 4.0 standard drinks of alcohol    Types: 4 Standard drinks or equivalent per week    Comment: few days a week, will have 1 shot before dinner.   Drug use: No   Sexual activity: Not on file  Other Topics Concern   Not on file  Social History Narrative   Not on file   Social Determinants of Health   Financial  Resource Strain: Not on file  Food Insecurity: Not on file  Transportation Needs: Not on file  Physical Activity: Not on file  Stress: Not on file  Social Connections: Not on file  Intimate Partner Violence: Not on file     No Known Allergies   Outpatient Medications Prior to Visit  Medication Sig Dispense Refill   allopurinol (ZYLOPRIM) 300 MG tablet Take 300 mg by mouth daily.     apixaban (ELIQUIS) 2.5 MG TABS tablet Take 1 tablet (2.5 mg total) by mouth 2 (two) times daily. 60 tablet 2   atorvastatin (LIPITOR) 10 MG tablet Take 10 mg by mouth at bedtime.     colchicine 0.6 MG tablet Take 2 tablets (1.2 mg) by mouth when you pick up your prescription.  2 hours after your first dose take one tablet.  12 hours later take one tablet.  Call your PCP for further medication/instructions. (Patient taking differently: Take 0.6 mg by mouth daily.) 4 tablet 0   magnesium gluconate (MAGONATE) 500 MG tablet Take 500 mg by mouth daily.     metFORMIN (GLUCOPHAGE-XR) 500 MG 24 hr tablet Take 500 mg by mouth 2 (two) times daily.     mirtazapine (REMERON) 7.5 MG tablet Take 1 tablet (7.5 mg total) by mouth at bedtime. 60 tablet 0   pantoprazole (PROTONIX) 40 MG tablet Take 1 tablet (40 mg total) by mouth 2 (two) times daily. 60 tablet 1   No facility-administered medications prior to visit.    Review of Systems  Constitutional:  Positive for malaise/fatigue and weight loss. Negative for chills and fever.  HENT:  Negative for hearing loss, sore throat and tinnitus.   Eyes:  Negative for blurred vision and double vision.  Respiratory:  Positive for cough. Negative for hemoptysis, sputum production, shortness of breath, wheezing and stridor.   Cardiovascular:  Negative for chest pain, palpitations, orthopnea, leg swelling and PND.  Gastrointestinal:  Negative for abdominal pain, constipation, diarrhea, heartburn, nausea and vomiting.  Genitourinary:  Negative for dysuria, hematuria and urgency.   Musculoskeletal:  Negative for joint pain and myalgias.  Skin:  Negative for itching and rash.  Neurological:  Negative for dizziness, tingling, weakness and headaches.  Endo/Heme/Allergies:  Negative for environmental allergies. Does not bruise/bleed easily.  Psychiatric/Behavioral:  Negative for depression. The patient is not nervous/anxious and does not have insomnia.   All other systems reviewed and are negative.    Objective:  Physical Exam Vitals reviewed.  Constitutional:      General: He is not in acute distress.    Appearance: He is well-developed.  HENT:     Head: Normocephalic and atraumatic.  Eyes:     General: No scleral icterus.    Conjunctiva/sclera: Conjunctivae normal.     Pupils: Pupils are equal, round, and reactive to light.  Neck:     Vascular: No JVD.  Trachea: No tracheal deviation.  Cardiovascular:     Rate and Rhythm: Normal rate and regular rhythm.     Heart sounds: Normal heart sounds. No murmur heard. Pulmonary:     Effort: Pulmonary effort is normal. No tachypnea, accessory muscle usage or respiratory distress.     Breath sounds: No stridor. No wheezing, rhonchi or rales.  Abdominal:     General: There is no distension.     Palpations: Abdomen is soft.     Tenderness: There is no abdominal tenderness.  Musculoskeletal:        General: No tenderness.     Cervical back: Neck supple.  Lymphadenopathy:     Cervical: No cervical adenopathy.  Skin:    General: Skin is warm and dry.     Capillary Refill: Capillary refill takes less than 2 seconds.     Findings: No rash.  Neurological:     Mental Status: He is alert and oriented to person, place, and time.  Psychiatric:        Behavior: Behavior normal.      Vitals:   08/14/21 1119  BP: 116/62  Pulse: 80  Temp: 98.2 F (36.8 C)  TempSrc: Oral  SpO2: 98%  Weight: 149 lb (67.6 kg)  Height: 5\' 10"  (1.778 m)   98% on RA BMI Readings from Last 3 Encounters:  08/14/21 21.38 kg/m   07/31/21 21.39 kg/m  06/13/21 23.09 kg/m   Wt Readings from Last 3 Encounters:  08/14/21 149 lb (67.6 kg)  07/31/21 149 lb 1.6 oz (67.6 kg)  06/13/21 160 lb 15 oz (73 kg)     CBC    Component Value Date/Time   WBC 26.1 (H) 07/31/2021 1014   RBC 3.91 (L) 07/31/2021 1205   RBC 3.73 (L) 07/31/2021 1014   HGB 10.8 (L) 07/31/2021 1014   HCT 33.9 (L) 07/31/2021 1014   PLT 419 (H) 07/31/2021 1014   MCV 90.9 07/31/2021 1014   MCH 29.0 07/31/2021 1014   MCHC 31.9 07/31/2021 1014   RDW 16.4 (H) 07/31/2021 1014   LYMPHSABS 1.7 07/31/2021 1014   MONOABS 2.1 (H) 07/31/2021 1014   EOSABS 0.3 07/31/2021 1014   BASOSABS 0.1 07/31/2021 1014    Chest Imaging: 08/09/2021 nuclear medicine pet imaging: Hypermetabolic lesion within the upper lobe, Left-sided, 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 The patient's images have been independently reviewed by me.    Pulmonary Functions Testing Results:     No data to display          FeNO:   Pathology:   Echocardiogram:   Heart Catheterization:     Assessment & Plan:     ICD-10-CM   1. Mass of upper lobe of left lung  R91.8     2. On continuous oral anticoagulation  Z79.01     3. Mediastinal adenopathy  R59.0     4. Abnormal PET of left lung  R94.2       Discussion:  This is a 88 year old gentleman history of tobacco use quit many years ago found to have a incidental left upper lobe lung mass.  He is on continuous anticoagulation with Eliquis.  He has an associated mediastinal adenopathy in the AP window.  We reviewed his pet imaging today with his family at bedside in the room.  We talked about whether or not we should move forward with consideration for tissue sampling and biopsy.  He is above 34 years old.  We talked about  whether or not we would want to seek treatments.  He states that he does want to proceed with treatments if in fact this is a lung cancer.  He is able  to complete most of his ADLs at baseline.  Plan: We talked about the risk of bleeding and pneumothorax related to bronchoscopy. Patient is agreeable to proceed with bronchoscopy. We will tentatively plan for August 1 next week. He will need to hold his Eliquis 2 days prior last dose of Eliquis on July 29.  He will need a 1 week appointment with Eric Form, NP    Current Outpatient Medications:    allopurinol (ZYLOPRIM) 300 MG tablet, Take 300 mg by mouth daily., Disp: , Rfl:    apixaban (ELIQUIS) 2.5 MG TABS tablet, Take 1 tablet (2.5 mg total) by mouth 2 (two) times daily., Disp: 60 tablet, Rfl: 2   atorvastatin (LIPITOR) 10 MG tablet, Take 10 mg by mouth at bedtime., Disp: , Rfl:    colchicine 0.6 MG tablet, Take 2 tablets (1.2 mg) by mouth when you pick up your prescription.  2 hours after your first dose take one tablet.  12 hours later take one tablet.  Call your PCP for further medication/instructions. (Patient taking differently: Take 0.6 mg by mouth daily.), Disp: 4 tablet, Rfl: 0   magnesium gluconate (MAGONATE) 500 MG tablet, Take 500 mg by mouth daily., Disp: , Rfl:    metFORMIN (GLUCOPHAGE-XR) 500 MG 24 hr tablet, Take 500 mg by mouth 2 (two) times daily., Disp: , Rfl:    mirtazapine (REMERON) 7.5 MG tablet, Take 1 tablet (7.5 mg total) by mouth at bedtime., Disp: 60 tablet, Rfl: 0   pantoprazole (PROTONIX) 40 MG tablet, Take 1 tablet (40 mg total) by mouth 2 (two) times daily., Disp: 60 tablet, Rfl: 1  I spent 63 minutes dedicated to the care of this patient on the date of this encounter to include pre-visit review of records, face-to-face time with the patient discussing conditions above, post visit ordering of testing, clinical documentation with the electronic health record, making appropriate referrals as documented, and communicating necessary findings to members of the patients care team.   Garner Nash, DO [facility] 08/14/2021 11:42 AM

## 2021-08-14 NOTE — Patient Instructions (Signed)
Thank you for visiting Dr. Valeta Harms at [facility]. Today we recommend the following:  Orders Placed This Encounter  Procedures   Procedural/ Surgical Case Request: ROBOTIC ASSISTED NAVIGATIONAL BRONCHOSCOPY   Ambulatory referral to Pulmonology   Bronchoscopy on August 1st  Return in about 2 weeks (around 08/28/2021) for w/ Eric Form, NP .    Please do your part to reduce the spread of COVID-19.

## 2021-08-14 NOTE — Progress Notes (Signed)
Synopsis: Referred in July 2023 for a lung mass by [name], MD  Subjective:   PATIENT ID: Ronnie Lawrence: male DOB: July 25, 1927, MRN: [id number]  Chief Complaint  Patient presents with   Consult    Pt is here for a consult for a mass in his lungs. No pulmonary side effects noted form patient at this time. PET scan was done 08/09/21    This is a 88 year old gentleman, past medical history of hypertension, stroke, diabetes, asthma, previous colonoscopy, pacemaker placement, previous EGD.  Patient is a former smoker quit 1981 smoked for 42 years.  Patient seen by Dr. Willey Blade primary care provider.Patient had a nuclear medicine PET scan completed on 08/09/2021 for evaluation of lung mass there was a hypermetabolic left upper lobe spiculated lesion concerning for a primary bronchogenic carcinoma with associated hypermetabolic AP window node concerning for nodal metastatic disease.  There is other minimally metabolic active mediastinal nodes.  Patient has already seen Dr. Delton Coombes at [place].  He is currently on Eliquis for atrial fibrillation.  Here today to discuss considerations for tissue biopsy.    Past Medical History:  Diagnosis Date   Asthma    Diabetes mellitus    Gout    Hypertension    Stroke [facility])    Wears dentures    partial upper     Family History  Problem Relation Age of Onset   Colon cancer Mother      Past Surgical History:  Procedure Laterality Date   BACK SURGERY     BIOPSY  06/13/2021   Procedure: BIOPSY;  Surgeon: Rogene Houston, MD;  [facility];  Service: Endoscopy;;   CATARACT EXTRACTION W/PHACO Left 08/31/2019   Procedure: CATARACT EXTRACTION PHACO AND INTRAOCULAR LENS PLACEMENT ([place]) LEFT DIABETIC 9.58 00:51.0;  Surgeon: Birder Robson, MD;  Location: [place];  Service: Ophthalmology;  Laterality: Left;  Diabetic - oral meds   CATARACT EXTRACTION W/PHACO Right 09/21/2019   Procedure: CATARACT EXTRACTION  PHACO AND INTRAOCULAR LENS PLACEMENT ([facility]) RIGHT DIABETIC 13.56  01:10.7;  Surgeon: Birder Robson, MD;  Location: [place];  Service: Ophthalmology;  Laterality: Right;   COLONOSCOPY N/A 08/13/2013   Procedure: COLONOSCOPY;  Surgeon: Rogene Houston, MD;  [facility];  Service: Endoscopy;  Laterality: N/A;  1050   COLONOSCOPY WITH PROPOFOL N/A 06/13/2021   Procedure: COLONOSCOPY WITH PROPOFOL;  Surgeon: Rogene Houston, MD;  [facility];  Service: Endoscopy;  Laterality: N/A;   ESOPHAGEAL DILATION  06/13/2021   Procedure: ESOPHAGEAL DILATION;  Surgeon: Rogene Houston, MD;  [facility];  Service: Endoscopy;;   ESOPHAGOGASTRODUODENOSCOPY (EGD) WITH PROPOFOL N/A 06/13/2021   Procedure: ESOPHAGOGASTRODUODENOSCOPY (EGD) WITH PROPOFOL;  Surgeon: Rogene Houston, MD;  [facility];  Service: Endoscopy;  Laterality: N/A;   HERNIA REPAIR     bilateral   PACEMAKER IMPLANT N/A 10/11/2019   Procedure: PACEMAKER IMPLANT;  Surgeon: Vickie Epley, MD;  Location: [place] [facility];  Service: Cardiovascular;  Laterality: N/A;    Social History   Socioeconomic History   Marital status: Widowed    Spouse name: Not on file   Number of children: Not on file   Years of education: Not on file   Highest education level: Not on file  Occupational History   Not on file  Tobacco Use   Smoking status: Former    Years: 15.00    Types: Cigarettes  Quit date: 56    Years since quitting: 42.5   Smokeless tobacco: Never  Vaping Use   Vaping Use: Never used  Substance and Sexual Activity   Alcohol use: Yes    Alcohol/week: 4.0 standard drinks of alcohol    Types: 4 Standard drinks or equivalent per week    Comment: few days a week, will have 1 shot before dinner.   Drug use: No   Sexual activity: Not on file  Other Topics Concern   Not on file  Social History Narrative   Not on file   Social Determinants of Health   Financial  Resource Strain: Not on file  Food Insecurity: Not on file  Transportation Needs: Not on file  Physical Activity: Not on file  Stress: Not on file  Social Connections: Not on file  Intimate Partner Violence: Not on file     No Known Allergies   Outpatient Medications Prior to Visit  Medication Sig Dispense Refill   allopurinol (ZYLOPRIM) 300 MG tablet Take 300 mg by mouth daily.     apixaban (ELIQUIS) 2.5 MG TABS tablet Take 1 tablet (2.5 mg total) by mouth 2 (two) times daily. 60 tablet 2   atorvastatin (LIPITOR) 10 MG tablet Take 10 mg by mouth at bedtime.     colchicine 0.6 MG tablet Take 2 tablets (1.2 mg) by mouth when you pick up your prescription.  2 hours after your first dose take one tablet.  12 hours later take one tablet.  Call your PCP for further medication/instructions. (Patient taking differently: Take 0.6 mg by mouth daily.) 4 tablet 0   magnesium gluconate (MAGONATE) 500 MG tablet Take 500 mg by mouth daily.     metFORMIN (GLUCOPHAGE-XR) 500 MG 24 hr tablet Take 500 mg by mouth 2 (two) times daily.     mirtazapine (REMERON) 7.5 MG tablet Take 1 tablet (7.5 mg total) by mouth at bedtime. 60 tablet 0   pantoprazole (PROTONIX) 40 MG tablet Take 1 tablet (40 mg total) by mouth 2 (two) times daily. 60 tablet 1   No facility-administered medications prior to visit.    Review of Systems  Constitutional:  Positive for malaise/fatigue and weight loss. Negative for chills and fever.  HENT:  Negative for hearing loss, sore throat and tinnitus.   Eyes:  Negative for blurred vision and double vision.  Respiratory:  Positive for cough. Negative for hemoptysis, sputum production, shortness of breath, wheezing and stridor.   Cardiovascular:  Negative for chest pain, palpitations, orthopnea, leg swelling and PND.  Gastrointestinal:  Negative for abdominal pain, constipation, diarrhea, heartburn, nausea and vomiting.  Genitourinary:  Negative for dysuria, hematuria and urgency.   Musculoskeletal:  Negative for joint pain and myalgias.  Skin:  Negative for itching and rash.  Neurological:  Negative for dizziness, tingling, weakness and headaches.  Endo/Heme/Allergies:  Negative for environmental allergies. Does not bruise/bleed easily.  Psychiatric/Behavioral:  Negative for depression. The patient is not nervous/anxious and does not have insomnia.   All other systems reviewed and are negative.    Objective:  Physical Exam Vitals reviewed.  Constitutional:      General: He is not in acute distress.    Appearance: He is well-developed.  HENT:     Head: Normocephalic and atraumatic.  Eyes:     General: No scleral icterus.    Conjunctiva/sclera: Conjunctivae normal.     Pupils: Pupils are equal, round, and reactive to light.  Neck:     Vascular: No JVD.  Trachea: No tracheal deviation.  Cardiovascular:     Rate and Rhythm: Normal rate and regular rhythm.     Heart sounds: Normal heart sounds. No murmur heard. Pulmonary:     Effort: Pulmonary effort is normal. No tachypnea, accessory muscle usage or respiratory distress.     Breath sounds: No stridor. No wheezing, rhonchi or rales.  Abdominal:     General: There is no distension.     Palpations: Abdomen is soft.     Tenderness: There is no abdominal tenderness.  Musculoskeletal:        General: No tenderness.     Cervical back: Neck supple.  Lymphadenopathy:     Cervical: No cervical adenopathy.  Skin:    General: Skin is warm and dry.     Capillary Refill: Capillary refill takes less than 2 seconds.     Findings: No rash.  Neurological:     Mental Status: He is alert and oriented to person, place, and time.  Psychiatric:        Behavior: Behavior normal.      Vitals:   08/14/21 1119  BP: 116/62  Pulse: 80  Temp: 98.2 F (36.8 C)  TempSrc: Oral  SpO2: 98%  Weight: 149 lb (67.6 kg)  Height: 5\' 10"  (1.778 m)   98% on RA BMI Readings from Last 3 Encounters:  08/14/21 21.38 kg/m   07/31/21 21.39 kg/m  06/13/21 23.09 kg/m   Wt Readings from Last 3 Encounters:  08/14/21 149 lb (67.6 kg)  07/31/21 149 lb 1.6 oz (67.6 kg)  06/13/21 160 lb 15 oz (73 kg)     CBC    Component Value Date/Time   WBC 26.1 (H) 07/31/2021 1014   RBC 3.91 (L) 07/31/2021 1205   RBC 3.73 (L) 07/31/2021 1014   HGB 10.8 (L) 07/31/2021 1014   HCT 33.9 (L) 07/31/2021 1014   PLT 419 (H) 07/31/2021 1014   MCV 90.9 07/31/2021 1014   MCH 29.0 07/31/2021 1014   MCHC 31.9 07/31/2021 1014   RDW 16.4 (H) 07/31/2021 1014   LYMPHSABS 1.7 07/31/2021 1014   MONOABS 2.1 (H) 07/31/2021 1014   EOSABS 0.3 07/31/2021 1014   BASOSABS 0.1 07/31/2021 1014    Chest Imaging: 08/09/2021 nuclear medicine pet imaging: Hypermetabolic lesion within the upper lobe, Left-sided, 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 The patient's images have been independently reviewed by me.    Pulmonary Functions Testing Results:     No data to display          FeNO:   Pathology:   Echocardiogram:   Heart Catheterization:     Assessment & Plan:     ICD-10-CM   1. Mass of upper lobe of left lung  R91.8     2. On continuous oral anticoagulation  Z79.01     3. Mediastinal adenopathy  R59.0     4. Abnormal PET of left lung  R94.2       Discussion:  This is a 88 year old gentleman history of tobacco use quit many years ago found to have a incidental left upper lobe lung mass.  He is on continuous anticoagulation with Eliquis.  He has an associated mediastinal adenopathy in the AP window.  We reviewed his pet imaging today with his family at bedside in the room.  We talked about whether or not we should move forward with consideration for tissue sampling and biopsy.  He is above 85 years old.  We talked about  whether or not we would want to seek treatments.  He states that he does want to proceed with treatments if in fact this is a lung cancer.  He is able  to complete most of his ADLs at baseline.  Plan: We talked about the risk of bleeding and pneumothorax related to bronchoscopy. Patient is agreeable to proceed with bronchoscopy. We will tentatively plan for August 1 next week. He will need to hold his Eliquis 2 days prior last dose of Eliquis on July 29.  He will need a 1 week appointment with Eric Form, NP    Current Outpatient Medications:    allopurinol (ZYLOPRIM) 300 MG tablet, Take 300 mg by mouth daily., Disp: , Rfl:    apixaban (ELIQUIS) 2.5 MG TABS tablet, Take 1 tablet (2.5 mg total) by mouth 2 (two) times daily., Disp: 60 tablet, Rfl: 2   atorvastatin (LIPITOR) 10 MG tablet, Take 10 mg by mouth at bedtime., Disp: , Rfl:    colchicine 0.6 MG tablet, Take 2 tablets (1.2 mg) by mouth when you pick up your prescription.  2 hours after your first dose take one tablet.  12 hours later take one tablet.  Call your PCP for further medication/instructions. (Patient taking differently: Take 0.6 mg by mouth daily.), Disp: 4 tablet, Rfl: 0   magnesium gluconate (MAGONATE) 500 MG tablet, Take 500 mg by mouth daily., Disp: , Rfl:    metFORMIN (GLUCOPHAGE-XR) 500 MG 24 hr tablet, Take 500 mg by mouth 2 (two) times daily., Disp: , Rfl:    mirtazapine (REMERON) 7.5 MG tablet, Take 1 tablet (7.5 mg total) by mouth at bedtime., Disp: 60 tablet, Rfl: 0   pantoprazole (PROTONIX) 40 MG tablet, Take 1 tablet (40 mg total) by mouth 2 (two) times daily., Disp: 60 tablet, Rfl: 1  I spent 63 minutes dedicated to the care of this patient on the date of this encounter to include pre-visit review of records, face-to-face time with the patient discussing conditions above, post visit ordering of testing, clinical documentation with the electronic health record, making appropriate referrals as documented, and communicating necessary findings to members of the patients care team.   Garner Nash, DO [facility] 08/14/2021 11:42 AM

## 2021-08-15 ENCOUNTER — Ambulatory Visit (HOSPITAL_COMMUNITY)
Admission: RE | Admit: 2021-08-15 | Discharge: 2021-08-15 | Disposition: A | Discharge status: Home / Self Care | Payer: Medicare Other | Source: Ambulatory Visit | Attending: Hematology | Admitting: Hematology

## 2021-08-15 DIAGNOSIS — I6782 Cerebral ischemia: Secondary | ICD-10-CM | POA: Diagnosis not present

## 2021-08-15 DIAGNOSIS — C3490 Malignant neoplasm of unspecified part of unspecified bronchus or lung: Secondary | ICD-10-CM | POA: Insufficient documentation

## 2021-08-15 MED ORDER — SODIUM CHLORIDE (PF) 0.9 % IJ SOLN
INTRAMUSCULAR | Status: AC
  Filled 2021-08-15: qty 50

## 2021-08-15 MED ORDER — IOHEXOL 300 MG/ML  SOLN
75.0000 mL | Freq: Once | INTRAMUSCULAR | Status: AC | PRN
  Administered 2021-08-15: 75 mL via INTRAVENOUS

## 2021-08-16 ENCOUNTER — Ambulatory Visit (HOSPITAL_COMMUNITY): Payer: Medicare Other | Admitting: Hematology

## 2021-08-17 ENCOUNTER — Other Ambulatory Visit: Payer: Self-pay | Admitting: Pulmonary Disease

## 2021-08-17 ENCOUNTER — Other Ambulatory Visit: Payer: Self-pay

## 2021-08-17 ENCOUNTER — Encounter (HOSPITAL_COMMUNITY): Payer: Self-pay | Admitting: Pulmonary Disease

## 2021-08-17 DIAGNOSIS — Z01818 Encounter for other preprocedural examination: Secondary | ICD-10-CM

## 2021-08-17 LAB — SARS CORONAVIRUS 2 (TAT 6-24 HRS): SARS Coronavirus 2: NEGATIVE

## 2021-08-20 ENCOUNTER — Encounter: Payer: Self-pay | Admitting: Cardiology

## 2021-08-20 ENCOUNTER — Encounter (HOSPITAL_COMMUNITY): Payer: Self-pay | Admitting: Pulmonary Disease

## 2021-08-20 ENCOUNTER — Other Ambulatory Visit: Payer: Self-pay

## 2021-08-20 NOTE — Progress Notes (Signed)
[facility] DEVICE PROGRAMMING  Patient Information: Name:  Ronnie Lawrence  DOB:  [birth year]  MRN:  [id number]    Planned Procedure:  Robotic Assisted Navigational Bronoschopy  Surgeon:  Dr. Valeta Harms  Date of Procedure:  08/21/21  Cautery will be used.  Position during surgery:  supine   Please send documentation back to:  [facility] (Fax # [phone])  Device Information:  Clinic EP Physician:  Dr. Lars Mage   Device Type:  Pacemaker Manufacturer and Phone #:  [employer]: [phone] Pacemaker Dependent?:  Unknown Date of Last Device Check:  07/09/21 Normal Device Function?:  Yes.    Electrophysiologist's Recommendations:  Have magnet available. Provide continuous ECG monitoring when magnet is used or reprogramming is to be performed.  Procedure will likely interfere with device function.  Device should be programmed:  Asynchronous pacing during procedure and returned to normal programming after procedure  Per Device Clinic Standing Orders, Simone Curia, RN  8:11 AM 08/20/2021

## 2021-08-20 NOTE — Progress Notes (Signed)
I called Ronnie. Ronnie Lawrence number, patient's daughter, Ronnie Lawrence answered the phone; Ronnie Lawrence is too hard of hearing to talk on the phone.  Ronnie Lawrence states that Ronnie Lawrence does not complain of chest pain or shortness of breath at rest. Ronnie Lawrence reports that Ronnie Lawrence does not have  any s/s of Covid in his household.and that patient denies any known exposure to Covid.   Ronnie. Lawrence PCP is Dr. [name]. Cardiologist is Dr. Quentin Ore.   Ronnie. Lawrence has a pacemaker, I requested peri-op orders from the device clinic. After receiving the orders I notified Ronnie Lawrence from [employer] of the orders.   RonnieLawrence has type II diabetes, patient takes Metformin 2 times a day.  Ronnie Lawrence does not have a CBG monitor.  Ronnie Lawrence lives alone, I told Ronnie Lawrence that patient will be required to have someone with him for the first 24 hours after surgery. Ronnie Lawrence said that Ronnie. Mauger that he has to have someone after surgery. Ronnie Lawrence said that he hospital will have to tell patient; patient can not hear on a phone, patient will have to be notified on arrival.

## 2021-08-21 ENCOUNTER — Ambulatory Visit (HOSPITAL_COMMUNITY): Payer: Medicare Other | Admitting: Anesthesiology

## 2021-08-21 ENCOUNTER — Ambulatory Visit (HOSPITAL_COMMUNITY)
Admission: RE | Admit: 2021-08-21 | Discharge: 2021-08-21 | Disposition: A | Discharge status: Home / Self Care | Payer: Medicare Other | Attending: Pulmonary Disease | Admitting: Pulmonary Disease

## 2021-08-21 ENCOUNTER — Ambulatory Visit (HOSPITAL_COMMUNITY): Payer: Medicare Other

## 2021-08-21 ENCOUNTER — Encounter (HOSPITAL_COMMUNITY)
Admission: RE | Disposition: A | Discharge status: Home / Self Care | Payer: Self-pay | Source: Home / Self Care | Attending: Pulmonary Disease

## 2021-08-21 ENCOUNTER — Encounter (HOSPITAL_COMMUNITY): Payer: Self-pay | Admitting: Pulmonary Disease

## 2021-08-21 ENCOUNTER — Ambulatory Visit (HOSPITAL_BASED_OUTPATIENT_CLINIC_OR_DEPARTMENT_OTHER): Payer: Medicare Other | Admitting: Anesthesiology

## 2021-08-21 DIAGNOSIS — C3412 Malignant neoplasm of upper lobe, left bronchus or lung: Secondary | ICD-10-CM | POA: Diagnosis not present

## 2021-08-21 DIAGNOSIS — I4891 Unspecified atrial fibrillation: Secondary | ICD-10-CM | POA: Diagnosis not present

## 2021-08-21 DIAGNOSIS — I10 Essential (primary) hypertension: Secondary | ICD-10-CM

## 2021-08-21 DIAGNOSIS — Z8673 Personal history of transient ischemic attack (TIA), and cerebral infarction without residual deficits: Secondary | ICD-10-CM | POA: Diagnosis not present

## 2021-08-21 DIAGNOSIS — Z7901 Long term (current) use of anticoagulants: Secondary | ICD-10-CM | POA: Diagnosis not present

## 2021-08-21 DIAGNOSIS — R590 Localized enlarged lymph nodes: Secondary | ICD-10-CM | POA: Insufficient documentation

## 2021-08-21 DIAGNOSIS — E119 Type 2 diabetes mellitus without complications: Secondary | ICD-10-CM | POA: Insufficient documentation

## 2021-08-21 DIAGNOSIS — Z87891 Personal history of nicotine dependence: Secondary | ICD-10-CM

## 2021-08-21 DIAGNOSIS — Z950 Presence of cardiac pacemaker: Secondary | ICD-10-CM | POA: Insufficient documentation

## 2021-08-21 DIAGNOSIS — R918 Other nonspecific abnormal finding of lung field: Secondary | ICD-10-CM

## 2021-08-21 DIAGNOSIS — Z7984 Long term (current) use of oral hypoglycemic drugs: Secondary | ICD-10-CM | POA: Diagnosis not present

## 2021-08-21 DIAGNOSIS — K219 Gastro-esophageal reflux disease without esophagitis: Secondary | ICD-10-CM | POA: Insufficient documentation

## 2021-08-21 DIAGNOSIS — I442 Atrioventricular block, complete: Secondary | ICD-10-CM | POA: Diagnosis not present

## 2021-08-21 HISTORY — PX: BRONCHIAL BIOPSY: SHX5109

## 2021-08-21 HISTORY — PX: BRONCHIAL NEEDLE ASPIRATION BIOPSY: SHX5106

## 2021-08-21 HISTORY — DX: Personal history of other medical treatment: Z92.89

## 2021-08-21 HISTORY — DX: Dyspnea, unspecified: R06.00

## 2021-08-21 HISTORY — PX: VIDEO BRONCHOSCOPY WITH RADIAL ENDOBRONCHIAL ULTRASOUND: SHX6849

## 2021-08-21 HISTORY — DX: Presence of cardiac pacemaker: Z95.0

## 2021-08-21 HISTORY — DX: Anemia, unspecified: D64.9

## 2021-08-21 LAB — GLUCOSE, CAPILLARY
Glucose-Capillary: 127 mg/dL — ABNORMAL HIGH (ref 70–99)
Glucose-Capillary: 104 mg/dL — ABNORMAL HIGH (ref 70–99)

## 2021-08-21 SURGERY — BRONCHOSCOPY, WITH BIOPSY USING ELECTROMAGNETIC NAVIGATION
Anesthesia: General | Laterality: Left

## 2021-08-21 MED ORDER — PHENYLEPHRINE HCL-NACL 20-0.9 MG/250ML-% IV SOLN
INTRAVENOUS | Status: DC | PRN
  Administered 2021-08-21: 25 ug/min via INTRAVENOUS
  Administered 2021-08-21: 50 ug/min via INTRAVENOUS

## 2021-08-21 MED ORDER — ACETAMINOPHEN 500 MG PO TABS
1000.0000 mg | ORAL_TABLET | Freq: Once | ORAL | Status: AC
  Administered 2021-08-21: 1000 mg via ORAL
  Filled 2021-08-21: qty 2

## 2021-08-21 MED ORDER — FENTANYL CITRATE (PF) 100 MCG/2ML IJ SOLN: 25.0000 ug | INTRAMUSCULAR | Status: DC | PRN

## 2021-08-21 MED ORDER — DEXAMETHASONE SODIUM PHOSPHATE 10 MG/ML IJ SOLN
INTRAMUSCULAR | Status: DC | PRN
  Administered 2021-08-21: 4 mg via INTRAVENOUS

## 2021-08-21 MED ORDER — SUGAMMADEX SODIUM 200 MG/2ML IV SOLN
INTRAVENOUS | Status: DC | PRN
  Administered 2021-08-21: 200 mg via INTRAVENOUS

## 2021-08-21 MED ORDER — INSULIN ASPART 100 UNIT/ML IJ SOLN
0.0000 [IU] | INTRAMUSCULAR | Status: DC | PRN
  Filled 2021-08-21: qty 0.07

## 2021-08-21 MED ORDER — CHLORHEXIDINE GLUCONATE 0.12 % MT SOLN
OROMUCOSAL | Status: AC
  Filled 2021-08-21: qty 15

## 2021-08-21 MED ORDER — CHLORHEXIDINE GLUCONATE 0.12 % MT SOLN
15.0000 mL | Freq: Once | OROMUCOSAL | Status: AC
  Administered 2021-08-21: 15 mL via OROMUCOSAL
  Filled 2021-08-21: qty 15

## 2021-08-21 MED ORDER — FENTANYL CITRATE (PF) 100 MCG/2ML IJ SOLN
INTRAMUSCULAR | Status: DC | PRN
  Administered 2021-08-21: 50 ug via INTRAVENOUS

## 2021-08-21 MED ORDER — LIDOCAINE 2% (20 MG/ML) 5 ML SYRINGE
INTRAMUSCULAR | Status: DC | PRN
  Administered 2021-08-21: 40 mg via INTRAVENOUS

## 2021-08-21 MED ORDER — PROPOFOL 10 MG/ML IV BOLUS
INTRAVENOUS | Status: DC | PRN
  Administered 2021-08-21: 110 mg via INTRAVENOUS

## 2021-08-21 MED ORDER — ONDANSETRON HCL 4 MG/2ML IJ SOLN
INTRAMUSCULAR | Status: DC | PRN
  Administered 2021-08-21: 4 mg via INTRAVENOUS

## 2021-08-21 MED ORDER — LACTATED RINGERS IV SOLN
INTRAVENOUS | Status: DC
Start: 2021-08-21 — End: 2021-08-21

## 2021-08-21 MED ORDER — ROCURONIUM BROMIDE 10 MG/ML (PF) SYRINGE
PREFILLED_SYRINGE | INTRAVENOUS | Status: DC | PRN
  Administered 2021-08-21: 60 mg via INTRAVENOUS

## 2021-08-21 NOTE — Progress Notes (Signed)
Per Dr. Glennon Mac, no pre-op labs needed.  Ronnie Lawrence

## 2021-08-21 NOTE — Discharge Instructions (Signed)
Flexible Bronchoscopy, Care After This sheet gives you information about how to care for yourself after your test. Your doctor may also give you more specific instructions. If you have problems or questions, contact your doctor. Follow these instructions at home: Eating and drinking Do not eat or drink anything (not even water) for 2 hours after your test, or until your numbing medicine (local anesthetic) wears off. When your numbness is gone and your cough and gag reflexes have come back, you may: Eat only soft foods. Slowly drink liquids. The day after the test, go back to your normal diet. Driving Do not drive for 24 hours if you were given a medicine to help you relax (sedative). Do not drive or use heavy machinery while taking prescription pain medicine. General instructions  Take over-the-counter and prescription medicines only as told by your doctor. Return to your normal activities as told. Ask what activities are safe for you. Do not use any products that have nicotine or tobacco in them. This includes cigarettes and e-cigarettes. If you need help quitting, ask your doctor. Keep all follow-up visits as told by your doctor. This is important. It is very important if you had a tissue sample (biopsy) taken. Get help right away if: You have shortness of breath that gets worse. You get light-headed. You feel like you are going to pass out (faint). You have chest pain. You cough up: More than a little blood. More blood than before. Summary Do not eat or drink anything (not even water) for 2 hours after your test, or until your numbing medicine wears off. Do not use cigarettes. Do not use e-cigarettes. Get help right away if you have chest pain.  This information is not intended to replace advice given to you by your health care provider. Make sure you discuss any questions you have with your health care provider. Document Released: 11/04/2008 Document Revised: 12/20/2016 Document  Reviewed: 01/26/2016 Elsevier Patient Education  2020 [employer].

## 2021-08-21 NOTE — Op Note (Signed)
Video Bronchoscopy with Robotic Assisted Bronchoscopic Navigation   Date of Operation: 08/21/2021   Pre-op Diagnosis: Left upper lobe lung mass  Post-op Diagnosis: Left upper lobe lung mass  Surgeon: Garner Nash ,DO   Assistants: None   Anesthesia: General endotracheal anesthesia  Operation: Flexible video fiberoptic bronchoscopy with robotic assistance and biopsies.  Estimated Blood Loss: Minimal  Complications: None  Indications and History: Ronnie Lawrence is a 88 y.o. male with history of left upper lobe lung mass. The risks, benefits, complications, treatment options and expected outcomes were discussed with the patient.  The possibilities of pneumothorax, pneumonia, reaction to medication, pulmonary aspiration, perforation of a viscus, bleeding, failure to diagnose a condition and creating a complication requiring transfusion or operation were discussed with the patient who freely signed the consent.    Description of Procedure: The patient was seen in the Preoperative Area, was examined and was deemed appropriate to proceed.  The patient was taken to [facility] endoscopy room. 3, identified as Landry Corporal and the procedure verified as Flexible Video Fiberoptic Bronchoscopy.  A Time Out was held and the above information confirmed.   Prior to the date of the procedure a high-resolution CT scan of the chest was performed. Utilizing ION software program a virtual tracheobronchial tree was generated to allow the creation of distinct navigation pathways to the patient's parenchymal abnormalities. After being taken to the operating room general anesthesia was initiated and the patient  was orally intubated. The video fiberoptic bronchoscope was introduced via the endotracheal tube and a general inspection was performed which showed normal right and left lung anatomy, aspiration of the bilateral mainstems was completed to remove any remaining secretions. Robotic catheter inserted into  patient's endotracheal tube.   Target #1 left upper lobe lung mass: The distinct navigation pathways prepared prior to this procedure were then utilized to navigate to patient's lesion identified on CT scan. The robotic catheter was secured into place and the vision probe was withdrawn.  Lesion location was approximated using fluoroscopy and radial endobronchial ultrasound for peripheral targeting. Under fluoroscopic guidance transbronchial needle biopsies, and transbronchial forceps biopsies were performed to be sent for cytology and pathology.   At the end of the procedure a general airway inspection was performed and there was no evidence of active bleeding. The bronchoscope was removed.  The patient tolerated the procedure well. There was no significant blood loss and there were no obvious complications. A post-procedural chest x-ray is pending.  Samples Target #1: 1. Transbronchial Wang needle biopsies from left upper lobe 2. Transbronchial forceps biopsies from left upper lobe  Plans:  The patient will be discharged from the PACU to home when recovered from anesthesia and after chest x-ray is reviewed. We will review the cytology, pathology results with the patient when they become available. Outpatient followup will be with Dr. Delton Coombes.   Garner Nash, DO [facility] 08/21/2021 11:35 AM

## 2021-08-21 NOTE — Anesthesia Preprocedure Evaluation (Addendum)
Anesthesia Evaluation  Patient identified by MRN, date of birth, ID band Patient awake    Reviewed: Allergy & Precautions, NPO status , Patient's Chart, lab work & pertinent test results  History of Anesthesia Complications Negative for: history of anesthetic complications  Airway Mallampati: II  TM Distance: >3 FB Neck ROM: Full    Dental  (+) Partial Upper, Dental Advisory Given, Missing   Pulmonary former smoker,    breath sounds clear to auscultation       Cardiovascular hypertension, + dysrhythmias Atrial Fibrillation + pacemaker  Rhythm:Regular Rate:Normal  Pacemaker: Atrioventricular block, Mobitz type 1, Wenckebach, Heart block AV complete ([place]), Cardiac pacemaker in situ      Neuro/Psych CVA, No Residual Symptoms    GI/Hepatic Neg liver ROS, GERD  Medicated and Controlled,  Endo/Other  diabetes (glu 127), Oral Hypoglycemic Agents  Renal/GU negative Renal ROS     Musculoskeletal   Abdominal   Peds  Hematology eliquis   Anesthesia Other Findings   Reproductive/Obstetrics                            Anesthesia Physical Anesthesia Plan  ASA: 3  Anesthesia Plan: General   Post-op Pain Management: Tylenol PO (pre-op)*   Induction: Intravenous  PONV Risk Score and Plan: 2 and Ondansetron and Dexamethasone  Airway Management Planned: Oral ETT  Additional Equipment: None  Intra-op Plan:   Post-operative Plan: Extubation in OR  Informed Consent: I have reviewed the patients History and Physical, chart, labs and discussed the procedure including the risks, benefits and alternatives for the proposed anesthesia with the patient or authorized representative who has indicated his/her understanding and acceptance.     Dental advisory given  Plan Discussed with: CRNA and Surgeon  Anesthesia Plan Comments:         Anesthesia Quick Evaluation

## 2021-08-21 NOTE — Interval H&P Note (Signed)
History and Physical Interval Note:  08/21/2021 9:02 AM  Ronnie Lawrence  has presented today for surgery, with the diagnosis of lung mass.  The various methods of treatment have been discussed with the patient and family. After consideration of risks, benefits and other options for treatment, the patient has consented to  Procedure(s) with comments: ROBOTIC ASSISTED NAVIGATIONAL BRONCHOSCOPY (Left) - ION w/ CIOS as a surgical intervention.  The patient's history has been reviewed, patient examined, no change in status, stable for surgery.  I have reviewed the patient's chart and labs.  Questions were answered to the patient's satisfaction.     Num[place]

## 2021-08-21 NOTE — Transfer of Care (Signed)
Immediate Anesthesia Transfer of Care Note  Patient: Ronnie Lawrence  Procedure(s) Performed: ROBOTIC ASSISTED NAVIGATIONAL BRONCHOSCOPY (Left) VIDEO BRONCHOSCOPY WITH RADIAL ENDOBRONCHIAL ULTRASOUND BRONCHIAL NEEDLE ASPIRATION BIOPSIES BRONCHIAL BIOPSIES  Patient Location: PACU  Anesthesia Type:General  Level of Consciousness: awake, alert  and oriented  Airway & Oxygen Therapy: Patient Spontanous Breathing  Post-op Assessment: Report given to RN and Post -op Vital signs reviewed and stable  Post vital signs: Reviewed and stable  Last Vitals:  Vitals Value Taken Time  BP 110/70 08/21/21 1136  Temp 36.8 C 08/21/21 1135  Pulse 79 08/21/21 1139  Resp 22 08/21/21 1139  SpO2 93 % 08/21/21 1139  Vitals shown include unvalidated device data.  Last Pain:  Vitals:   08/21/21 0842  PainSc: 0-No pain         Complications: No notable events documented.

## 2021-08-21 NOTE — Anesthesia Procedure Notes (Signed)
Procedure Name: Intubation Date/Time: 08/21/2021 11:02 AM  Performed by: Mariea Clonts, CRNAPre-anesthesia Checklist: Patient identified, Emergency Drugs available, Suction available and Patient being monitored Patient Re-evaluated:Patient Re-evaluated prior to induction Oxygen Delivery Method: Circle System Utilized Preoxygenation: Pre-oxygenation with 100% oxygen Induction Type: IV induction Ventilation: Mask ventilation without difficulty Laryngoscope Size: Miller and 2 Grade View: Grade I Tube type: Oral Tube size: 8.5 mm Number of attempts: 1 Airway Equipment and Method: Stylet and Oral airway Placement Confirmation: ETT inserted through vocal cords under direct vision, positive ETCO2 and breath sounds checked- equal and bilateral Tube secured with: Tape Dental Injury: Teeth and Oropharynx as per pre-operative assessment

## 2021-08-22 NOTE — Anesthesia Postprocedure Evaluation (Signed)
Anesthesia Post Note  Patient: Ronnie Lawrence  Procedure(s) Performed: ROBOTIC ASSISTED NAVIGATIONAL BRONCHOSCOPY (Left) VIDEO BRONCHOSCOPY WITH RADIAL ENDOBRONCHIAL ULTRASOUND BRONCHIAL NEEDLE ASPIRATION BIOPSIES BRONCHIAL BIOPSIES     Patient location during evaluation: PACU Anesthesia Type: General Level of consciousness: awake and alert Pain management: pain level controlled Vital Signs Assessment: post-procedure vital signs reviewed and stable Respiratory status: spontaneous breathing, nonlabored ventilation, respiratory function stable and patient connected to nasal cannula oxygen Cardiovascular status: blood pressure returned to baseline and stable Postop Assessment: no apparent nausea or vomiting Anesthetic complications: no   No notable events documented.  Last Vitals:  Vitals:   08/21/21 1200 08/21/21 1205  BP: (!) 100/48 (!) 106/55  Pulse: 68 71  Resp: 20 20  Temp:  36.8 C  SpO2: 94% 94%    Last Pain:  Vitals:   08/21/21 1205  PainSc: 0-No pain                 Darol Cush

## 2021-08-23 ENCOUNTER — Encounter (HOSPITAL_COMMUNITY): Payer: Self-pay | Admitting: Pulmonary Disease

## 2021-08-24 LAB — CYTOLOGY - NON PAP

## 2021-08-28 DIAGNOSIS — C3492 Malignant neoplasm of unspecified part of left bronchus or lung: Secondary | ICD-10-CM | POA: Insufficient documentation

## 2021-08-29 ENCOUNTER — Inpatient Hospital Stay: Payer: Medicare Other | Attending: Hematology | Admitting: Hematology

## 2021-08-29 DIAGNOSIS — C3412 Malignant neoplasm of upper lobe, left bronchus or lung: Secondary | ICD-10-CM | POA: Insufficient documentation

## 2021-08-29 DIAGNOSIS — Z79899 Other long term (current) drug therapy: Secondary | ICD-10-CM | POA: Insufficient documentation

## 2021-08-29 DIAGNOSIS — Z7901 Long term (current) use of anticoagulants: Secondary | ICD-10-CM | POA: Diagnosis not present

## 2021-08-29 DIAGNOSIS — D471 Chronic myeloproliferative disease: Secondary | ICD-10-CM | POA: Diagnosis not present

## 2021-08-29 DIAGNOSIS — C3492 Malignant neoplasm of unspecified part of left bronchus or lung: Secondary | ICD-10-CM

## 2021-08-29 NOTE — Patient Instructions (Addendum)
[place] at [facility] Discharge Instructions   You were seen and examined today by Dr. Delton Coombes.  He reviewed the results of your PET scan which shows a Stage III lung cancer of the left lung. Surgery normally would be an option, but given your age and other health problems, this is likely not an option for you.   Other treatment would involve chemotherapy and radiation. You have understandably chosen not to pursue this option.   We will make a referral to [facility]. They will come check on you periodically and make sure you are comfortable.    Thank you for choosing [place] at [facility] to provide your oncology and hematology care.  To afford each patient quality time with our provider, please arrive at least 15 minutes before your scheduled appointment time.   If you have a lab appointment with the [facility] please come in thru the Main Entrance and check in at the main information desk.  You need to re-schedule your appointment should you arrive 10 or more minutes late.  We strive to give you quality time with our providers, and arriving late affects you and other patients whose appointments are after yours.  Also, if you no show three or more times for appointments you may be dismissed from the clinic at the providers discretion.     Again, thank you for choosing [facility].  Our hope is that these requests will decrease the amount of time that you wait before being seen by our physicians.       _____________________________________________________________  Should you have questions after your visit to [facility], please contact our office at [phone] and follow the prompts.  Our office hours are 8:00 a.m. and 4:30 p.m. Monday - Friday.  Please note that voicemails left after 4:00 p.m. may not be returned until the following business day.  We are closed weekends and major holidays.   You do have access to a nurse 24-7, just call the main number to the clinic [phone] and do not press any options, hold on the line and a nurse will answer the phone.    For prescription refill requests, have your pharmacy contact our office and allow 72 hours.    Due to Covid, you will need to wear a mask upon entering the hospital. If you do not have a mask, a mask will be given to you at the Main Entrance upon arrival. For doctor visits, patients may have 1 support person age 74 or older with them. For treatment visits, patients can not have anyone with them due to social distancing guidelines and our immunocompromised population.

## 2021-08-30 ENCOUNTER — Ambulatory Visit: Payer: Medicare Other | Admitting: Pulmonary Disease

## 2021-08-30 ENCOUNTER — Telehealth: Payer: Self-pay | Admitting: Radiation Oncology

## 2021-08-30 ENCOUNTER — Telehealth: Payer: Self-pay | Admitting: Pulmonary Disease

## 2021-08-30 NOTE — Progress Notes (Signed)
Patient Care Team: [name], MD as PCP - General (Internal Medicine) Vickie Epley, MD as PCP - Electrophysiology (Cardiology)  DIAGNOSIS:  Encounter Diagnosis  Name Primary?   Squamous cell lung cancer, left ([place])     SUMMARY OF ONCOLOGIC HISTORY: Oncology History   No history exists.    CHIEF COMPLIANT: Newly diagnosed left lung squamous cell carcinoma.  INTERVAL HISTORY: Ronnie Lawrence is a 88 y.o. seen in follow-up visit today for newly diagnosed squamous cell left lung cancer.  He had bronchoscopy and biopsy done by Dr. Valeta Harms on 08/21/2021.  He reports fatigue with energy levels at 10%.  Denies any chest pain or hemoptysis.  REVIEW OF SYSTEMS:   Constitutional: Fatigue with energy levels 10%. Eyes: Denies blurriness of vision Ears, nose, mouth, throat, and face: Denies mucositis or sore throat Respiratory: Denies cough, dyspnea or wheezes Cardiovascular: Denies palpitation, chest discomfort Gastrointestinal:  Denies nausea, heartburn or change in bowel habits Skin: Denies abnormal skin rashes Lymphatics: Denies new lymphadenopathy or easy bruising Neurological:Denies numbness, tingling or new weaknesses Behavioral/Psych: Mood is stable, no new changes  Extremities: No lower extremity edema All other systems were reviewed with the patient and are negative.  I have reviewed the past medical history, past surgical history, social history and family history with the patient and they are unchanged from previous note.  ALLERGIES:  has No Known Allergies.  MEDICATIONS:  Current Outpatient Medications  Medication Sig Dispense Refill   allopurinol (ZYLOPRIM) 300 MG tablet Take 300 mg by mouth daily.     apixaban (ELIQUIS) 2.5 MG TABS tablet Take 1 tablet (2.5 mg total) by mouth 2 (two) times daily. 60 tablet 2   atorvastatin (LIPITOR) 10 MG tablet Take 10 mg by mouth at bedtime.     colchicine 0.6 MG tablet Take 2 tablets (1.2 mg) by mouth when you pick up your  prescription.  2 hours after your first dose take one tablet.  12 hours later take one tablet.  Call your PCP for further medication/instructions. (Patient taking differently: Take 0.6 mg by mouth daily.) 4 tablet 0   magnesium gluconate (MAGONATE) 500 MG tablet Take 500 mg by mouth daily.     metFORMIN (GLUCOPHAGE-XR) 500 MG 24 hr tablet Take 500 mg by mouth 2 (two) times daily.     mirtazapine (REMERON) 7.5 MG tablet Take 1 tablet (7.5 mg total) by mouth at bedtime. 60 tablet 0   pantoprazole (PROTONIX) 40 MG tablet Take 1 tablet (40 mg total) by mouth 2 (two) times daily. 60 tablet 1   No current facility-administered medications for this visit.    PHYSICAL EXAMINATION: ECOG PERFORMANCE STATUS: 2 - Symptomatic, <50% confined to bed  Vitals:   08/29/21 0949  BP: (!) 101/49  Pulse: 90  Resp: 16  Temp: 98.7 F (37.1 C)  SpO2: 97%   Filed Weights   08/29/21 0949  Weight: 145 lb 4.8 oz (65.9 kg)    GENERAL:alert, no distress and comfortable SKIN: skin color, texture, turgor are normal, no rashes or significant lesions EYES: normal, Conjunctiva are pink and non-injected, sclera clear OROPHARYNX:no mucositis, no erythema and lips, buccal mucosa, and tongue normal  NECK: supple, thyroid normal size, non-tender, without nodularity LYMPH:  no palpable lymphadenopathy in the cervical, axillary or inguinal LUNGS: clear to auscultation and percussion with normal breathing effort HEART: regular rate & rhythm and no murmurs and no lower extremity edema ABDOMEN:abdomen soft, non-tender and normal bowel sounds MUSCULOSKELETAL:no cyanosis of digits and no  clubbing  EXTREMITIES: No lower extremity edema   LABORATORY DATA:  I have reviewed the data as listed    Latest Ref Rng & Units 07/31/2021   11:44 AM 06/14/2021    4:15 AM 06/12/2021    1:30 AM  CMP  Glucose 70 - 99 mg/dL 142  89  100   BUN 8 - 23 mg/dL 23  14  19    Creatinine 0.61 - 1.24 mg/dL 1.37  1.22  1.05   Sodium 135 - 145  mmol/L 135  139  138   Potassium 3.5 - 5.1 mmol/L 4.8  3.5  4.2   Chloride 98 - 111 mmol/L 104  107  108   CO2 22 - 32 mmol/L 24  26  25    Calcium 8.9 - 10.3 mg/dL 8.7  8.2  8.4   Total Protein 6.5 - 8.1 g/dL 7.3     Total Bilirubin 0.3 - 1.2 mg/dL 1.5     Alkaline Phos 38 - 126 U/L 122     AST 15 - 41 U/L 19     ALT 0 - 44 U/L 18      No results found for: "LYY503"   Lab Results  Component Value Date   WBC 26.1 (H) 07/31/2021   HGB 10.8 (L) 07/31/2021   HCT 33.9 (L) 07/31/2021   MCV 90.9 07/31/2021   PLT 419 (H) 07/31/2021   NEUTROABS 21.8 (H) 07/31/2021    ASSESSMENT:  1.  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  Enlarged lymph nodes in the mediastinum and hilar region largest 1.5 x 1 cm in the AP window.  Low-attenuation in the body of L1 vertebra, likely hemangioma.  2.8 cm nodule in the left adrenal gland, possibly adenoma. - 14 pound weight loss in the last 6 weeks.  Feels "lifeless".   CALR mutation positive MPN: - Seen at the request of Dr. Willey Blade for leukocytosis. - Weight loss 25 pounds in the last 6 months due to decreased appetite.  No fevers or night sweats. - Reports phlebotomies done in the 1980s, due to "too much blood". 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 - Based on his age, he is high risk.  However he did not have any prior history of thrombosis.  As he had normal hemoglobin and platelets, I did not recommend cytoreductive therapy.   Social/family history: - Lives by himself and is independent of ADLs and IADLs. - Worked as a Librarian, academic at [employer].  Quit smoking in 1983.  Smoked 1 pack/day for 30 years.  No asbestos exposure. - Sister had breast and lung cancer.  Brother had colon  cancer.  Mother had colon cancer.   PLAN:  1.  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 - We have reviewed images of the PET CT scan. - We discussed treatment options including concurrent chemoradiation therapy. - He has a lot of fatigue.  We also discussed palliative care in the form of hospice. - We discussed pros and cons.  Upon full discussion, palliative/hospice care was recommended. - RTC as needed.   Derek Jack, MD 08/30/21

## 2021-08-30 NOTE — Telephone Encounter (Signed)
FYI to the provider.

## 2021-08-30 NOTE — Telephone Encounter (Signed)
8/10 @ 10:38 am; spoke to patient's daughter patient decine any appts/treatments.

## 2021-08-31 ENCOUNTER — Other Ambulatory Visit: Payer: Self-pay | Admitting: *Deleted

## 2021-08-31 NOTE — Progress Notes (Signed)
The proposed treatment discussed in conference is for discussion purpose only and is not a binding recommendation.  The patients have not been physically examined, or presented with their treatment options.  Therefore, final treatment plans cannot be decided.  

## 2021-09-16 ENCOUNTER — Encounter (HOSPITAL_COMMUNITY): Payer: Self-pay | Admitting: Emergency Medicine

## 2021-09-16 ENCOUNTER — Emergency Department (HOSPITAL_COMMUNITY)

## 2021-09-16 ENCOUNTER — Emergency Department (HOSPITAL_COMMUNITY)
Admission: EM | Admit: 2021-09-16 | Discharge: 2021-09-16 | Disposition: A | Discharge status: Home / Self Care | Attending: Emergency Medicine | Admitting: Emergency Medicine

## 2021-09-16 DIAGNOSIS — S40012A Contusion of left shoulder, initial encounter: Secondary | ICD-10-CM | POA: Insufficient documentation

## 2021-09-16 DIAGNOSIS — W19XXXA Unspecified fall, initial encounter: Secondary | ICD-10-CM

## 2021-09-16 DIAGNOSIS — E119 Type 2 diabetes mellitus without complications: Secondary | ICD-10-CM | POA: Insufficient documentation

## 2021-09-16 DIAGNOSIS — W07XXXA Fall from chair, initial encounter: Secondary | ICD-10-CM | POA: Insufficient documentation

## 2021-09-16 DIAGNOSIS — S0003XA Contusion of scalp, initial encounter: Secondary | ICD-10-CM | POA: Insufficient documentation

## 2021-09-16 DIAGNOSIS — T148XXA Other injury of unspecified body region, initial encounter: Secondary | ICD-10-CM

## 2021-09-16 DIAGNOSIS — S0990XA Unspecified injury of head, initial encounter: Secondary | ICD-10-CM | POA: Diagnosis present

## 2021-09-16 DIAGNOSIS — Y92009 Unspecified place in unspecified non-institutional (private) residence as the place of occurrence of the external cause: Secondary | ICD-10-CM | POA: Insufficient documentation

## 2021-09-16 DIAGNOSIS — I959 Hypotension, unspecified: Secondary | ICD-10-CM | POA: Diagnosis not present

## 2021-09-16 DIAGNOSIS — I10 Essential (primary) hypertension: Secondary | ICD-10-CM | POA: Insufficient documentation

## 2021-09-16 DIAGNOSIS — Z7901 Long term (current) use of anticoagulants: Secondary | ICD-10-CM | POA: Insufficient documentation

## 2021-09-16 NOTE — ED Triage Notes (Signed)
Pt to the ED after a fall at home with a hematoma on the posterior of his head.  Pt is A&O x 4, denies loss of consciousness, but is on eliquis.

## 2021-09-16 NOTE — Discharge Instructions (Addendum)
Note the imaging studies today were overall reassuring.  Hold your Eliquis dose for tonight.  I recommend following up with your primary care provider tomorrow or the next day for reevaluation of your symptoms.  If you note expansion of your hematoma, please do not hesitate to return to the emergency department.  You can take Tylenol as needed for pain.  If you change your mind on further work-up in the emergency department for your falls, please not hesitate to return.  If you change your mind on wanting more at home care, please do not hesitate to return as we can help set that up.  If the worrisome signs and symptoms we discussed become apparent, also please do not hesitate to return to the emergency department.  I will attach information regarding fall prevention strategies you can utilize in your home.

## 2021-09-16 NOTE — ED Provider Triage Note (Signed)
Emergency Medicine Provider Triage Evaluation Note  Ronnie Lawrence , a 88 y.o. male  was evaluated in triage.  Pt complains of fall.  Patient states that he was getting up his recliner for the past 2 nights when he was getting up his recliner.  He states that his walker slipped out in front of him and he fell forward.  He reports being on Eliquis.  He denies any loss of consciousness.   He denies visual changes, slurred speech, weakness/sensory deficits, facial droop, gait abnormalities.  Review of Systems  Positive: See above Negative:   Physical Exam  BP (!) 93/57   Pulse 92   Temp (!) 97.5 F (36.4 C) (Oral)   Resp (!) 92   Ht 5\' 10"  (1.778 m)   Wt 65.9 kg   SpO2 95%   BMI 20.85 kg/m  Gen:   Awake, no distress   Resp:  Normal effort  MSK:   Moves extremities without difficulty  Other:  Tenderness palpation of left shoulder.  Medical Decision Making  Medically screening exam initiated at 12:01 PM.  Appropriate orders placed.  Ronnie Lawrence was informed that the remainder of the evaluation will be completed by another provider, this initial triage assessment does not replace that evaluation, and the importance of remaining in the ED until their evaluation is complete.     Wilnette Kales, [place] 09/16/21 1843

## 2021-09-16 NOTE — ED Provider Notes (Cosign Needed Addendum)
[facility] EMERGENCY DEPARTMENT Provider Note   CSN: [id number] Arrival date & time: 09/16/21  1120     History  Chief Complaint  Patient presents with   Ronnie Lawrence is a 88 y.o. male.   Fall   88 year old male presents emergency department with complaints of fall.  Patient states that he was getting up his recliner for the past 2 nights when he was getting up his recliner.  He states that his walker slipped out in front of him and he fell forward.  He reports being on Eliquis.  He denies any loss of consciousness.  He was able to get up unassisted.  He denies visual changes, weakness/sensory deficits, facial droop, gait abnormalities, slurred speech.  Patient states that he lives alone and daughter and son-in-law are coming him in the emergency department.  He request imaging but denies any blood work or EKG or medication.  He just wants to see whether or not he has a fracture or brain bleed.  Patient states that he does not want help at home and refuses family or external care.  He does have hospice nurses coming occasionally but none to assist with ADLs.  Patient also complaining of left shoulder "soreness" but states he been able to use affected arm just fine.  Past medical history significant for CVA, diabetes mellitus, hypertension, Mobitz 1 AV block, atrial flutter of which she is anticoagulated on Eliquis  Home Medications Prior to Admission medications   Medication Sig Start Date End Date Taking? Authorizing Provider  allopurinol (ZYLOPRIM) 300 MG tablet Take 300 mg by mouth daily. 03/21/21   [provider]  apixaban (ELIQUIS) 2.5 MG TABS tablet Take 1 tablet (2.5 mg total) by mouth 2 (two) times daily. 06/21/21 09/19/21  ShahmehdiValeria Batman, MD  atorvastatin (LIPITOR) 10 MG tablet Take 10 mg by mouth at bedtime.    [provider]  colchicine 0.6 MG tablet Take 2 tablets (1.2 mg) by mouth when you pick up your prescription.  2 hours after your first  dose take one tablet.  12 hours later take one tablet.  Call your PCP for further medication/instructions. Patient taking differently: Take 0.6 mg by mouth daily. 04/19/20   Vickie Epley, MD  magnesium gluconate (MAGONATE) 500 MG tablet Take 500 mg by mouth daily.    [provider]  metFORMIN (GLUCOPHAGE-XR) 500 MG 24 hr tablet Take 500 mg by mouth 2 (two) times daily.    [provider]  mirtazapine (REMERON) 7.5 MG tablet Take 1 tablet (7.5 mg total) by mouth at bedtime. 06/14/21 08/15/21  Shahmehdi, Valeria Batman, MD  pantoprazole (PROTONIX) 40 MG tablet Take 1 tablet (40 mg total) by mouth 2 (two) times daily. 06/14/21 08/15/21  Deatra James, MD      Allergies    Patient has no known allergies.    Review of Systems   Review of Systems  All other systems reviewed and are negative.   Physical Exam Updated Vital Signs BP (!) 102/56 (BP Location: Right Arm)   Pulse 72   Temp (!) 97.5 F (36.4 C) (Oral)   Resp 17   Ht 5\' 10"  (1.778 m)   Wt 65.9 kg   SpO2 95%   BMI 20.85 kg/m  Physical Exam Vitals and nursing note reviewed.  Constitutional:      General: He is not in acute distress.    Appearance: He is well-developed.  HENT:     Head:  Normocephalic.     Comments: Hematoma noted on patient's occiput with no obvious breaks in skin.    Right Ear: Tympanic membrane normal.     Left Ear: Tympanic membrane normal.     Nose: Nose normal.     Comments: No obvious septal hematoma.    Mouth/Throat:     Mouth: Mucous membranes are moist.     Pharynx: Oropharynx is clear.  Eyes:     General:        Right eye: No discharge.        Left eye: No discharge.     Extraocular Movements: Extraocular movements intact.     Conjunctiva/sclera: Conjunctivae normal.     Pupils: Pupils are equal, round, and reactive to light.  Neck:     Comments: No midline tenderness of cervical, thoracic, lumbar spine with no obvious step-off or deformity noted.  No overlying skin  abnormalities noted. Cardiovascular:     Rate and Rhythm: Normal rate and regular rhythm.     Pulses: Normal pulses.     Heart sounds: No murmur heard. Pulmonary:     Effort: Pulmonary effort is normal. No respiratory distress.     Breath sounds: Normal breath sounds. No wheezing.  Chest:     Chest wall: No tenderness.  Abdominal:     Palpations: Abdomen is soft.     Tenderness: There is no abdominal tenderness. There is no guarding.  Musculoskeletal:        General: No swelling.     Cervical back: Neck supple. No rigidity or tenderness.     Comments: Mild tenderness noted over left humeral head as well as left distal clavicle.  Patient has full active range of motion of affected shoulder without pain.  Patient has no tenderness to palpation of anterior posterior chest wall, right upper extremity, bilateral lower extremities.  He moves all 4 extremities without difficulty.  Skin:    General: Skin is warm and dry.     Capillary Refill: Capillary refill takes less than 2 seconds.  Neurological:     General: No focal deficit present.     Mental Status: He is alert and oriented to person, place, and time.     Comments: Alert and oriented to self, place, time and event.   Speech is fluent, clear without dysarthria or dysphasia.   Strength symmetric and equal in upper/lower extremities   Sensation intact in upper/lower extremities   Normal gait per patient and family. No pronator drift.  Normal finger-to-nose CN I not tested  CN II grossly intact visual fields bilaterally. Did not visualize posterior eye.  CN III, IV, VI PERRLA and EOMs intact bilaterally  CN V Intact sensation to sharp and light touch to the face  CN VII facial movements symmetric  CN VIII not tested  CN IX, X no uvula deviation, symmetric rise of soft palate  CN XI 5/5 SCM and trapezius strength bilaterally  CN XII Midline tongue protrusion, symmetric L/R movements     Psychiatric:        Mood and Affect:  Mood normal.     ED Results / Procedures / Treatments   Labs (all labs ordered are listed, but only abnormal results are displayed) Labs Reviewed - No data to display  EKG None  Radiology DG Shoulder Left  Result Date: 09/16/2021 CLINICAL DATA:  pain pain post fall EXAM: LEFT SHOULDER - 2+ VIEW COMPARISON:  08/21/2021 FINDINGS: AC spurs. Mild glenohumeral degenerative spurring. No fracture or dislocation.  Subclavian transvenous generator and leads partially visualized. Left suprahilar mass as before. IMPRESSION: 1. No fracture or dislocation. 2. Acromioclavicular and glenohumeral degenerative changes as above 3. Left suprahilar mass Electronically Signed   By: Lucrezia Europe M.D.   On: 09/16/2021 12:39   CT Cervical Spine Wo Contrast  Result Date: 09/16/2021 CLINICAL DATA:  Trauma EXAM: CT CERVICAL SPINE WITHOUT CONTRAST TECHNIQUE: Multidetector CT imaging of the cervical spine was performed without intravenous contrast. Multiplanar CT image reconstructions were also generated. RADIATION DOSE REDUCTION: This exam was performed according to the departmental dose-optimization program which includes automated exposure control, adjustment of the mA and/or kV according to patient size and/or use of iterative reconstruction technique. COMPARISON:  None Available. FINDINGS: Alignment: There is reversal of lordosis. Alignment of posterior margins of vertebral bodies is unremarkable. There is mild levoscoliosis. Skull base and vertebrae: No recent fracture is seen. Soft tissues and spinal canal: Posterior bony spurs are causing extrinsic pressure from C3 to T1 levels. Disc levels: There is encroachment of neural foramina from C2-T1 levels. Upper chest: Blebs and bullae are seen in the apices. Other: There is inhomogeneous attenuation and thyroid. IMPRESSION: No recent fracture is seen. Cervical spondylosis with encroachment of neural foramina from C2-T1 levels. Electronically Signed   By: Elmer Picker  M.D.   On: 09/16/2021 12:35   CT Head Wo Contrast  Result Date: 09/16/2021 CLINICAL DATA:  Trauma, fall EXAM: CT HEAD WITHOUT CONTRAST TECHNIQUE: Contiguous axial images were obtained from the base of the skull through the vertex without intravenous contrast. RADIATION DOSE REDUCTION: This exam was performed according to the departmental dose-optimization program which includes automated exposure control, adjustment of the mA and/or kV according to patient size and/or use of iterative reconstruction technique. COMPARISON:  08/15/2021 FINDINGS: Brain: No acute intracranial findings are seen in noncontrast CT brain. Cortical sulci are prominent. There is old infarct in right parietal cortex. There is decreased density in periventricular and subcortical white matter. Vascular: Scattered arterial calcifications are seen. Skull: No fracture is seen. Sinuses/Orbits: Unremarkable. Other: None. IMPRESSION: No acute intracranial findings are seen in noncontrast CT brain. Atrophy. Small vessel disease. Old infarct is seen in the right parietal cortex. No significant interval changes are noted. Electronically Signed   By: Elmer Picker M.D.   On: 09/16/2021 12:31    Procedures Procedures    Medications Ordered in ED Medications - No data to display  ED Course/ Medical Decision Making/ A&P                           Medical Decision Making Amount and/or Complexity of Data Reviewed Radiology: ordered.   This patient presents to the ED for concern of fall, this involves an extensive number of treatment options, and is a complaint that carries with it a high risk of complications and morbidity.  The differential diagnosis includes laceration, intracranial hemorrhage, fracture, strain/sprain, hematoma   Co morbidities that complicate the patient evaluation  See HPI   Additional history obtained:  Additional history obtained from EMR External records from outside source obtained and reviewed  including CT head from 08/15/2021   Lab Tests:  N/a.  Patient declined   Imaging Studies ordered:  I ordered imaging studies including CT head, C-spine.  X-ray left shoulder I independently visualized and interpreted imaging which showed  CT head, C-spine: No recent fracture.  Cervical spondylosis with encroachment of the neural foramina from C2-T1.  No acute intracranial findings.  Atrophy.  Small vessel disease.  Old infarct in parietal cortex. I agree with the radiologist interpretation  Cardiac Monitoring: / EKG:  The patient was maintained on a cardiac monitor.  I personally viewed and interpreted the cardiac monitored which showed an underlying rhythm of: Sinus rhythm   Consultations Obtained:  N/a   Problem List / ED Course / Critical interventions / Medication management  Follow Reevaluation of the patient after these medicines showed that the patient stayed the same I have reviewed the patients home medicines and have made adjustments as needed   Social Determinants of Health:  Lives alone and refuses assistance.  Baseline gait instability but refuses help with ADLs.   Test / Admission - Considered:  Vitals signs significant for hypotension with a blood pressure of 90 systolic.  Patient remained persistently hypotensive while emergency department.  Patient declined further evaluation/treatment of hypotension.  Otherwise, vital signs within normal range and stable throughout visit. Imaging studies significant for: See above Patient without obvious fracture or intracranial abnormality, from imaging studies performed.  He is currently denying laboratory or medication or EKG at this time.  I discussed with him the potential harm given his falls seem mechanical in nature of not assessing basic labs and EKG, he knowledge understanding and still declined.  Patient was deemed to have capacity.  Patient also declined at home health either via family or through outside care.   Patient recommended to stop denies dose of Eliquis and continue tomorrow as long as hematoma does not expand on patient's posterior head.  Close follow-up with primary care recommended tomorrow or the next day for reevaluation of symptoms.  Treatment plan discussed with the patient and family and they knowledge understanding were agreeable to said plan. Worrisome signs and symptoms were discussed with the patient, and the patient acknowledged understanding to return to the ED if noticed. Patient was stable upon discharge.         Final Clinical Impression(s) / ED Diagnoses Final diagnoses:  Fall, initial encounter  Hematoma    Rx / DC Orders ED Discharge Orders     None         Wilnette Kales, [place] 09/16/21 1352    Wilnette Kales, [place] 09/16/21 1659    Isla Pence, MD 09/17/21 1501

## 2021-09-17 ENCOUNTER — Telehealth: Payer: Self-pay | Admitting: Cardiology

## 2021-09-17 NOTE — Telephone Encounter (Signed)
Called Renee back and informed her that patient has a [employer] investment [employer]

## 2021-09-17 NOTE — Telephone Encounter (Signed)
Renee from [facility] is calling to see if patient has ICD implant. Please advise.

## 2021-10-08 ENCOUNTER — Ambulatory Visit (INDEPENDENT_AMBULATORY_CARE_PROVIDER_SITE_OTHER): Payer: Medicare Other

## 2021-10-08 DIAGNOSIS — I442 Atrioventricular block, complete: Secondary | ICD-10-CM

## 2021-10-09 LAB — CUP PACEART REMOTE DEVICE CHECK
Battery Remaining Longevity: 69 mo
Battery Remaining Percentage: 81 %
Battery Voltage: 2.99 V
Brady Statistic AP VP Percent: 13 %
Brady Statistic AP VS Percent: 2.6 %
Brady Statistic AS VP Percent: 65 %
Brady Statistic AS VS Percent: 20 %
Brady Statistic RA Percent Paced: 15 %
Brady Statistic RV Percent Paced: 77 %
Date Time Interrogation Session: 20230918020218
Implantable Lead Implant Date: 20210920
Implantable Lead Implant Date: 20210920
Implantable Lead Location: 753859
Implantable Lead Location: 753860
Implantable Pulse Generator Implant Date: 20210920
Lead Channel Impedance Value: 440 Ohm
Lead Channel Impedance Value: 630 Ohm
Lead Channel Pacing Threshold Amplitude: 0.625 V
Lead Channel Pacing Threshold Amplitude: 1.25 V
Lead Channel Pacing Threshold Pulse Width: 0.5 ms
Lead Channel Pacing Threshold Pulse Width: 0.5 ms
Lead Channel Sensing Intrinsic Amplitude: 3.4 mV
Lead Channel Sensing Intrinsic Amplitude: 4.9 mV
Lead Channel Setting Pacing Amplitude: 1.5 V
Lead Channel Setting Pacing Amplitude: 5 V
Lead Channel Setting Pacing Pulse Width: 0.5 ms
Lead Channel Setting Sensing Sensitivity: 2 mV
Pulse Gen Model: 2272
Pulse Gen Serial Number: 3860652

## 2021-10-19 NOTE — Progress Notes (Signed)
Remote pacemaker transmission.   

## 2021-12-06 ENCOUNTER — Telehealth: Payer: Self-pay | Admitting: Cardiology

## 2021-12-06 NOTE — Telephone Encounter (Signed)
Called to speak to daughter. Requesting information on Merlin return kit. Phone number provided. Offered condolences to family. Advised if they have further questions or concerns to please call back. Voiced understanding and appreciative of call.

## 2021-12-06 NOTE — Telephone Encounter (Signed)
New Message:     Patient  passed away on November 29, 2021. Daughter would like for you to call her lease.

## 2021-12-21 DEATH — deceased

## 2022-01-18 ENCOUNTER — Telehealth: Payer: Self-pay

## 2022-01-18 NOTE — Telephone Encounter (Signed)
The patient daughter had questions. I let her speak with Benjamine Mola.

## 2022-01-18 NOTE — Telephone Encounter (Signed)
Spoke with daughter informed her that the pacemaker was for the electrical component of the heart and that if the pumping function was the ultimate failure then the pacemaker couldn't fix a pumping function. Also informed patients daughter that unless the transmitter transmitted at the time patient was actively dying  then we may not necessarily know that patient passed. Patients daughter appreciative of call and explanation

## 2022-06-18 IMAGING — CT CT HEAD W/O CM
3 series · 15 of 47 positions shown, 18 images · non-contrast
Comparison: None.

CLINICAL DATA: Cognitive impairment.

EXAM:
CT HEAD WITHOUT CONTRAST
TECHNIQUE: Contiguous axial images were obtained from the base of the skull
through the vertex without intravenous contrast.

[Series 2: head w o · axial · 0.47mm/px · z∈[-60,+70]mm · 9 of 32 slices shown, 12 images]
[im 3/32  brain]
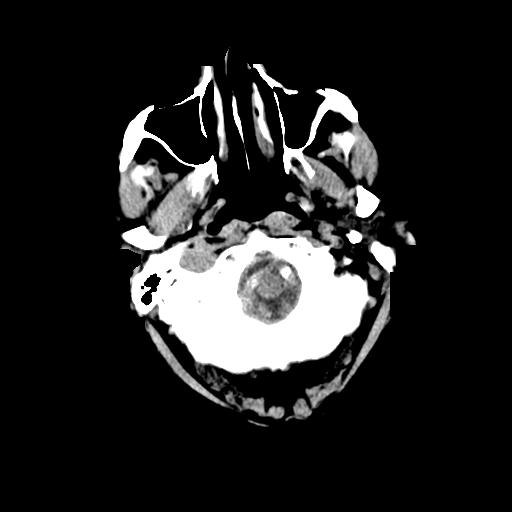
[im 3/32  bone]
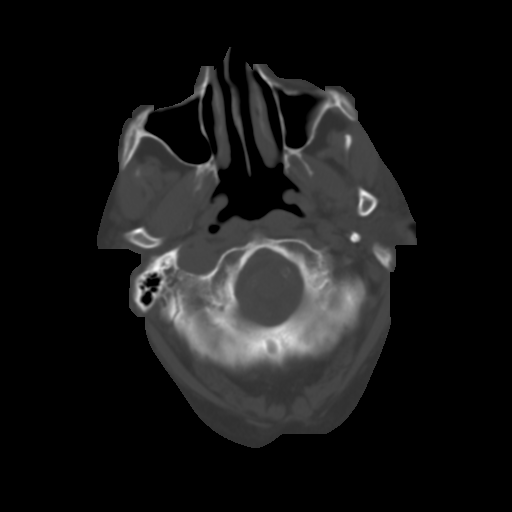
[im 6/32  brain]
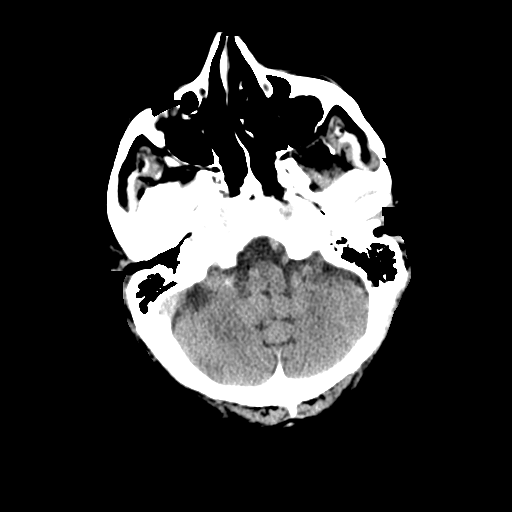
[im 9/32  brain]
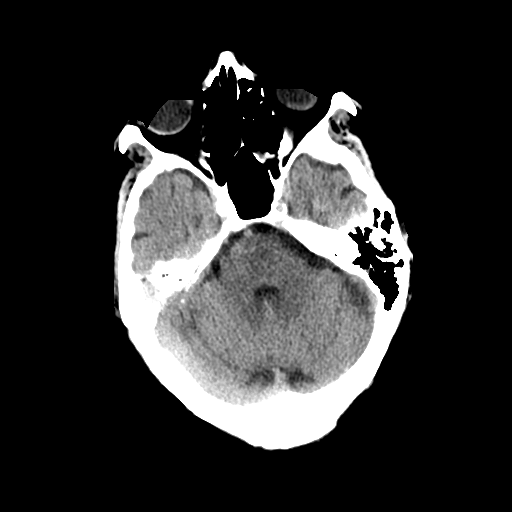
[im 12/32  brain]
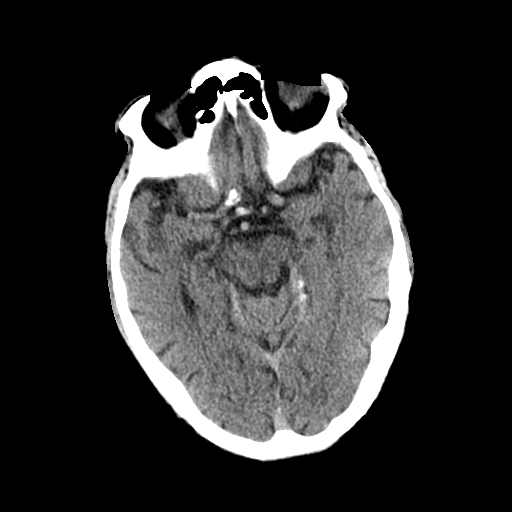
[im 17/32  brain]
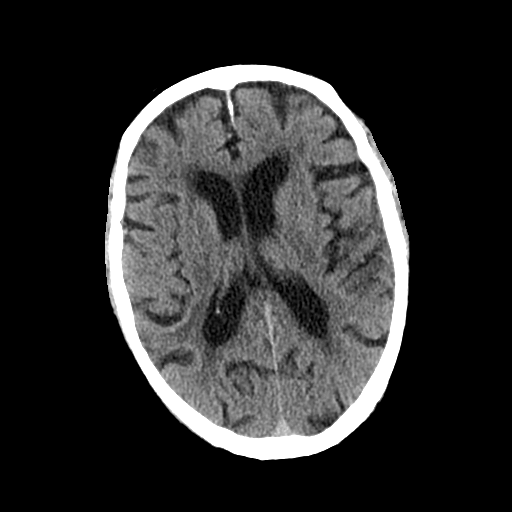
[im 17/32  bone]
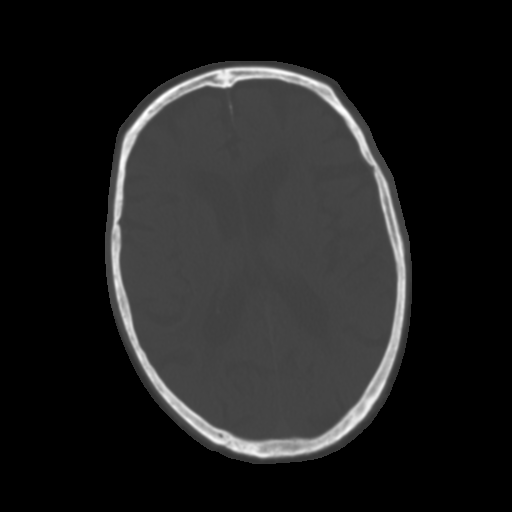
[im 20/32  brain]
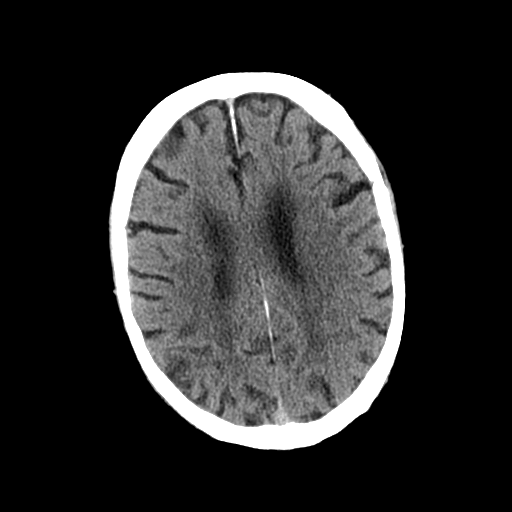
[im 23/32  brain]
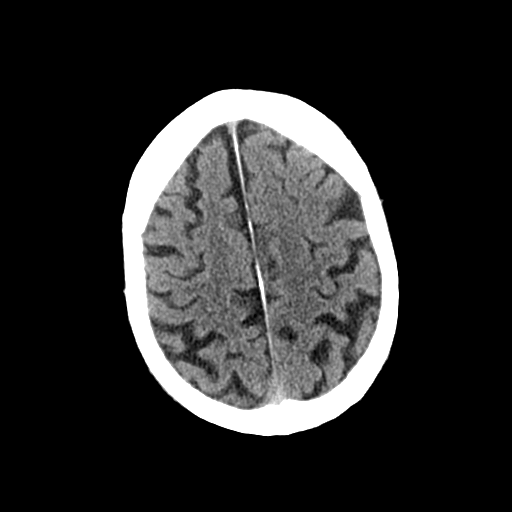
[im 26/32  brain]
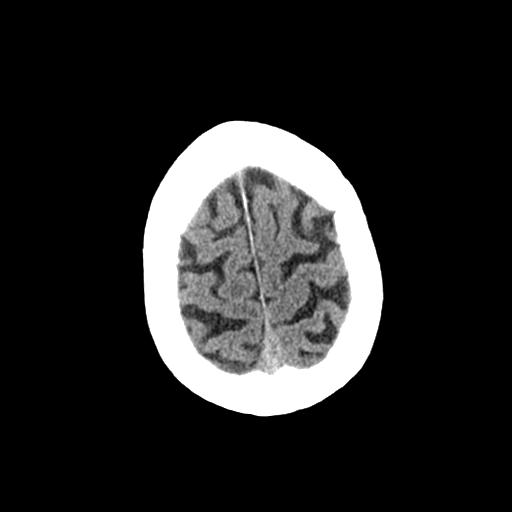
[im 29/32  brain]
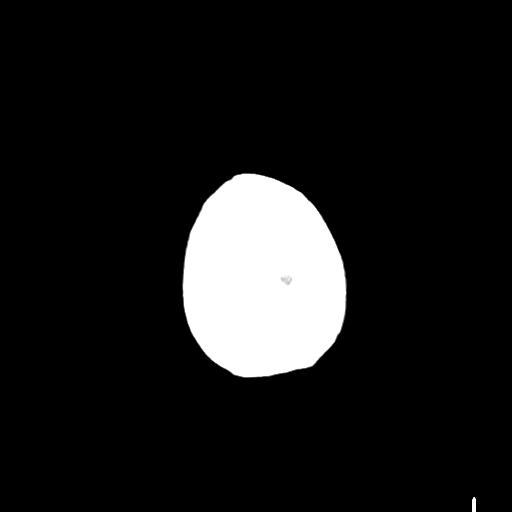
[im 29/32  bone]
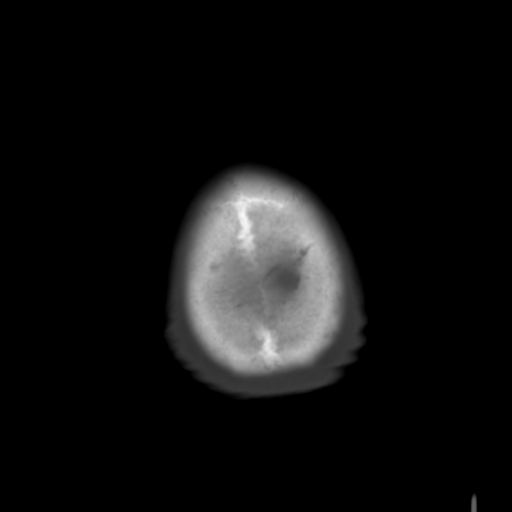

[Series 4: coronal soft · coronal · 0.34mm/px · 3 of 73 slices shown]
[im 25/73  brain]
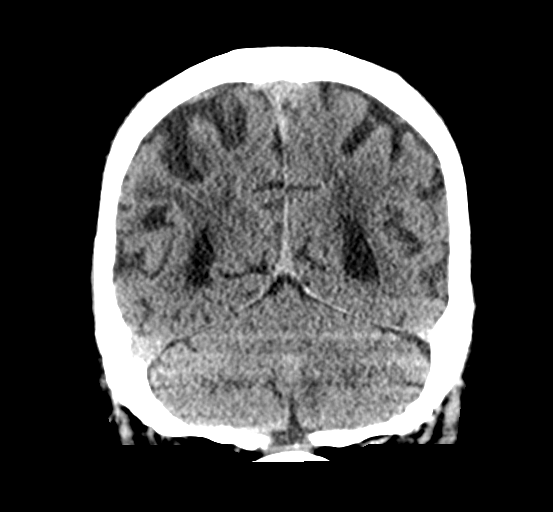
[im 33/73  brain]
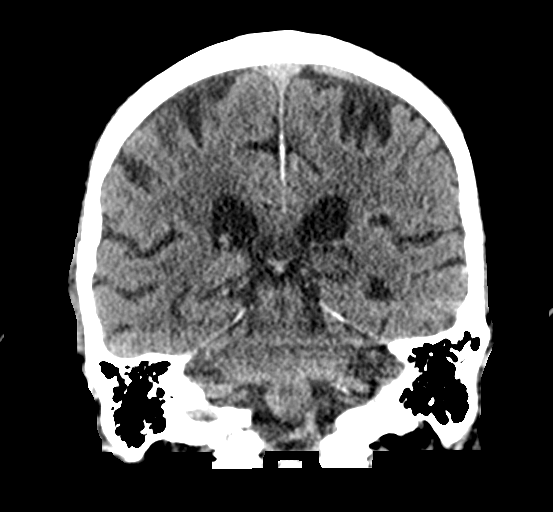
[im 41/73  brain]
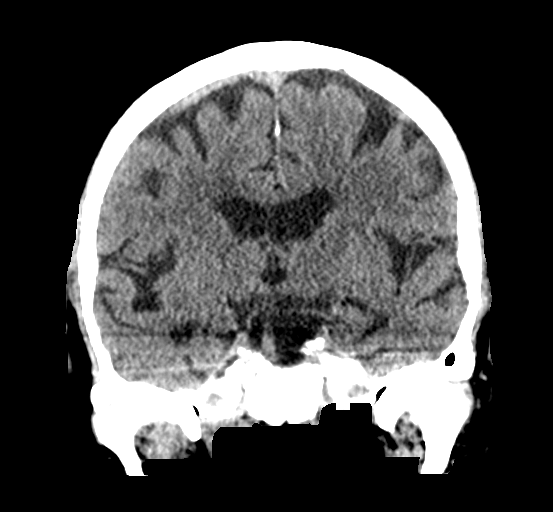

[Series 5: sagittal soft · sagittal · 0.34mm/px · 3 of 64 slices shown]
[im 22/64  brain]
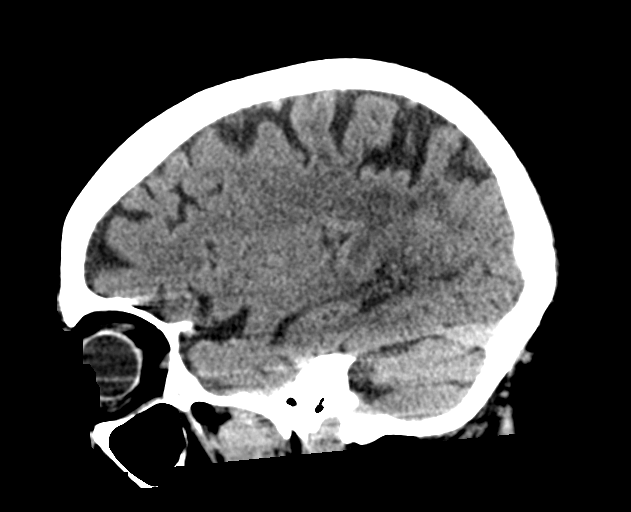
[im 32/64  brain]
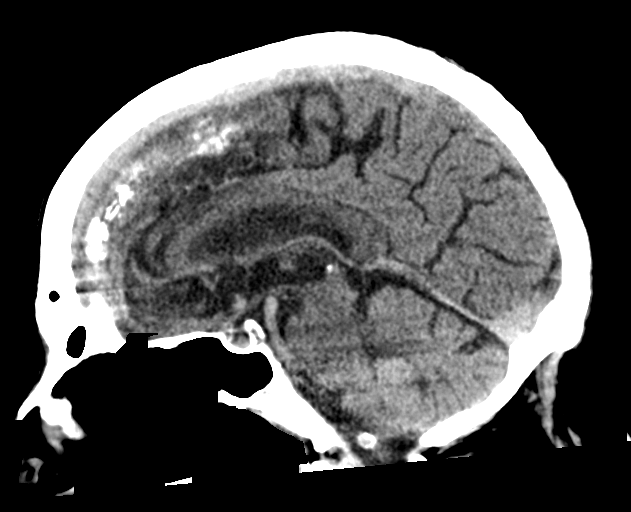
[im 43/64  brain]
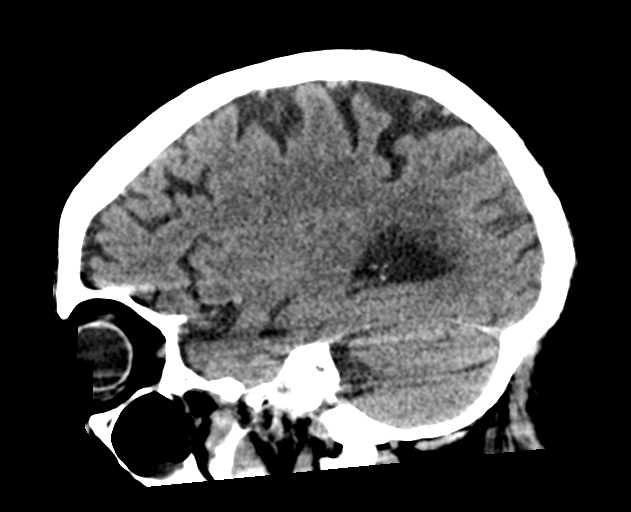

[15 of 47 positions shown; findings below may reference images not displayed]

FINDINGS: Brain: Small area of hypodensity and loss of gray differentiation
the right parietal cortex (series 2, image 20; series 4, image 51;
series 5, image 18). No evidence of acute hemorrhage, hydrocephalus,
extra-axial collection or mass lesion/mass effect. Additional mild
to moderate patchy white matter hypoattenuation, nonspecific but
compatible with chronic microvascular ischemic disease. Mild to
moderate generalized cerebral atrophy with ex vacuo ventricular
dilation. Small remote right cerebellar lacunar infarct.

Vascular: No hyperdense vessel identified. Calcific intracranial
atherosclerosis.

Skull: No acute fracture.

Sinuses/Orbits: Left sphenoid sinus and inferior left maxillary
sinus mucosal thickening. Otherwise, sinuses are clear. No acute
orbital findings.

Other: No mastoid effusions. Probable cerumen in bilateral external
auditory canals.
IMPRESSION: 1. Small area of hypodensity and loss of gray differentiation the
right parietal cortex, suggestive of age-indeterminate infarct that
is potentially acute or subacute. An MRI could further evaluate and
provide more sensitive evaluation for acute infarct. Alternatively,
comparison with outside priors may be helpful to assess chronicity
if available.
2. Mild to moderate chronic microvascular ischemic disease and
generalized.

These results will be called to the ordering clinician or
representative by the Radiologist Assistant, and communication
documented in the PACS or [REDACTED].
